# Patient Record
Sex: Female | Born: 1940 | Race: White | Hispanic: No | Marital: Married | Smoking: Never smoker
Health system: Southern US, Community
[De-identification: ages and names within clinical notes are randomized; demographics above are authoritative.]

## PROBLEM LIST (undated history)

## (undated) DIAGNOSIS — E7800 Pure hypercholesterolemia, unspecified: Secondary | ICD-10-CM

## (undated) DIAGNOSIS — M7052 Other bursitis of knee, left knee: Secondary | ICD-10-CM

## (undated) DIAGNOSIS — C4490 Unspecified malignant neoplasm of skin, unspecified: Secondary | ICD-10-CM

## (undated) DIAGNOSIS — M179 Osteoarthritis of knee, unspecified: Secondary | ICD-10-CM

## (undated) DIAGNOSIS — E559 Vitamin D deficiency, unspecified: Secondary | ICD-10-CM

## (undated) DIAGNOSIS — Z974 Presence of external hearing-aid: Secondary | ICD-10-CM

## (undated) DIAGNOSIS — E785 Hyperlipidemia, unspecified: Secondary | ICD-10-CM

## (undated) DIAGNOSIS — M1990 Unspecified osteoarthritis, unspecified site: Secondary | ICD-10-CM

## (undated) DIAGNOSIS — Z8619 Personal history of other infectious and parasitic diseases: Secondary | ICD-10-CM

## (undated) DIAGNOSIS — E039 Hypothyroidism, unspecified: Secondary | ICD-10-CM

## (undated) DIAGNOSIS — M8580 Other specified disorders of bone density and structure, unspecified site: Secondary | ICD-10-CM

## (undated) HISTORY — PX: SKIN CANCER DESTRUCTION: SHX778

## (undated) HISTORY — PX: TONSILLECTOMY: SUR1361

## (undated) HISTORY — PX: JOINT REPLACEMENT: SHX530

## (undated) HISTORY — PX: FINGER SURGERY: SHX640

## (undated) HISTORY — DX: Personal history of other infectious and parasitic diseases: Z86.19

## (undated) HISTORY — PX: BILATERAL SALPINGOOPHORECTOMY: SHX1223

## (undated) HISTORY — DX: Unspecified malignant neoplasm of skin, unspecified: C44.90

## (undated) HISTORY — DX: Hyperlipidemia, unspecified: E78.5

## (undated) HISTORY — DX: Vitamin D deficiency, unspecified: E55.9

## (undated) HISTORY — PX: APPENDECTOMY: SHX54

## (undated) HISTORY — DX: Osteoarthritis of knee, unspecified: M17.9

## (undated) HISTORY — DX: Presence of external hearing-aid: Z97.4

## (undated) HISTORY — DX: Other specified disorders of bone density and structure, unspecified site: M85.80

---

## 1988-12-10 HISTORY — PX: THYROIDECTOMY: SHX17

## 1993-12-10 HISTORY — PX: ABDOMINAL HYSTERECTOMY: SHX81

## 2005-12-10 HISTORY — PX: MENISCUS REPAIR: SHX5179

## 2014-01-25 ENCOUNTER — Other Ambulatory Visit (HOSPITAL_COMMUNITY): Payer: Self-pay | Admitting: *Deleted

## 2014-05-03 ENCOUNTER — Other Ambulatory Visit: Payer: Self-pay | Admitting: Orthopedic Surgery

## 2014-05-13 ENCOUNTER — Encounter (HOSPITAL_COMMUNITY): Payer: Self-pay | Admitting: Pharmacy Technician

## 2014-05-14 NOTE — Progress Notes (Signed)
CBC, CMET, TSH, Vitamin D, TSH, UA results 01/2014 on chart, EKG 01/28/14 on chart, surgery clearance note Dr. Waynard Edwards 02/02/14 on chart

## 2014-05-14 NOTE — Patient Instructions (Addendum)
20 Lori Meyers  05/14/2014   Your procedure is scheduled on: 05/24/14  Report to [facility] at 6:00 AM.  Call this number if you have problems the morning of surgery [phone]: [phone]   Remember:   Do not eat food or drink liquids After Midnight.     Take these medicines the morning of surgery with A SIP OF WATER: zetia, synthroid, cytomel   Do not wear jewelry, make-up or nail polish.  Do not wear lotions, powders, or perfumes. You may wear deodorant.  Do not shave 48 hours prior to surgery. Men may shave face and neck.  Do not bring valuables to the hospital.  Contacts, dentures or bridgework may not be worn into surgery.  Leave suitcase in the car. After surgery it may be brought to your room.  For patients admitted to the hospital, checkout time is 11:00 AM the day of discharge.    Please read over the following fact sheets that you were given: MRSA Information Birdie Sons, RN  pre op nurse call if needed [phone]    [facility] - Preparing for Surgery Before surgery, you can play an important role.  Because skin is not sterile, your skin needs to be as free of germs as possible.  You can reduce the number of germs on your skin by washing with CHG (chlorahexidine gluconate) soap before surgery.  CHG is an antiseptic cleaner which kills germs and bonds with the skin to continue killing germs even after washing. Please DO NOT use if you have an allergy to CHG or antibacterial soaps.  If your skin becomes reddened/irritated stop using the CHG and inform your nurse when you arrive at Short Stay. Do not shave (including legs and underarms) for at least 48 hours prior to the first CHG shower.  You may shave your face/neck. Please follow these instructions carefully:  1.  Shower with CHG Soap the night before surgery and the  morning of Surgery.  2.  If you choose to wash your hair, wash your hair first as usual with your  normal  shampoo.  3.  After you shampoo,  rinse your hair and body thoroughly to remove the  shampoo.                            4.  Use CHG as you would any other liquid soap.  You can apply chg directly  to the skin and wash                       Gently with a scrungie or clean washcloth.  5.  Apply the CHG Soap to your body ONLY FROM THE NECK DOWN.   Do not use on face/ open                           Wound or open sores. Avoid contact with eyes, ears mouth and genitals (private parts).                       Wash face,  Genitals (private parts) with your normal soap.             6.  Wash thoroughly, paying special attention to the area where your surgery  will be performed.  7.  Thoroughly rinse your body with warm water from the neck down.  8.  DO NOT shower/wash with your normal soap after using and rinsing off  the CHG Soap.                9.  Pat yourself dry with a clean towel.            10.  Wear clean pajamas.            11.  Place clean sheets on your bed the night of your first shower and do not  sleep with pets. Day of Surgery : Do not apply any lotions/deodorants the morning of surgery.  Please wear clean clothes to the hospital/surgery center.  FAILURE TO FOLLOW THESE INSTRUCTIONS MAY RESULT IN THE CANCELLATION OF YOUR SURGERY PATIENT SIGNATURE_________________________________  NURSE SIGNATURE__________________________________  ________________________________________________________________________  WHAT IS A BLOOD TRANSFUSION? Blood Transfusion Information  A transfusion is the replacement of blood or some of its parts. Blood is made up of multiple cells which provide different functions.  Red blood cells carry oxygen and are used for blood loss replacement.  White blood cells fight against infection.  Platelets control bleeding.  Plasma helps clot blood.  Other blood products are available for specialized needs, such as hemophilia or other clotting disorders. BEFORE THE TRANSFUSION  Who gives blood for  transfusions?   Healthy volunteers who are fully evaluated to make sure their blood is safe. This is blood bank blood. Transfusion therapy is the safest it has ever been in the practice of medicine. Before blood is taken from a donor, a complete history is taken to make sure that person has no history of diseases nor engages in risky social behavior (examples are intravenous drug use or sexual activity with multiple partners). The donor's travel history is screened to minimize risk of transmitting infections, such as malaria. The donated blood is tested for signs of infectious diseases, such as HIV and hepatitis. The blood is then tested to be sure it is compatible with you in order to minimize the chance of a transfusion reaction. If you or a relative donates blood, this is often done in anticipation of surgery and is not appropriate for emergency situations. It takes many days to process the donated blood. RISKS AND COMPLICATIONS Although transfusion therapy is very safe and saves many lives, the main dangers of transfusion include:   Getting an infectious disease.  Developing a transfusion reaction. This is an allergic reaction to something in the blood you were given. Every precaution is taken to prevent this. The decision to have a blood transfusion has been considered carefully by your caregiver before blood is given. Blood is not given unless the benefits outweigh the risks. AFTER THE TRANSFUSION  Right after receiving a blood transfusion, you will usually feel much better and more energetic. This is especially true if your red blood cells have gotten low (anemic). The transfusion raises the level of the red blood cells which carry oxygen, and this usually causes an energy increase.  The nurse administering the transfusion will monitor you carefully for complications. HOME CARE INSTRUCTIONS  No special instructions are needed after a transfusion. You may find your energy is better. Speak with  your caregiver about any limitations on activity for underlying diseases you may have. SEEK MEDICAL CARE IF:   Your condition is not improving after your transfusion.  You develop redness or irritation at the intravenous (IV) site. SEEK IMMEDIATE MEDICAL CARE IF:  Any of the following symptoms occur over the next 12 hours:  Shaking chills.  You  have a temperature by mouth above 102 F (38.9 C), not controlled by medicine.  Chest, back, or muscle pain.  People around you feel you are not acting correctly or are confused.  Shortness of breath or difficulty breathing.  Dizziness and fainting.  You get a rash or develop hives.  You have a decrease in urine output.  Your urine turns a dark color or changes to pink, red, or brown. Any of the following symptoms occur over the next 10 days:  You have a temperature by mouth above 102 F (38.9 C), not controlled by medicine.  Shortness of breath.  Weakness after normal activity.  The white part of the eye turns yellow (jaundice).  You have a decrease in the amount of urine or are urinating less often.  Your urine turns a dark color or changes to pink, red, or brown. Document Released: 11/23/2000 Document Revised: 02/18/2012 Document Reviewed: 07/12/2008 ExitCare Patient Information 2014 [place].  _______________________________________________________________________  Incentive Spirometer  An incentive spirometer is a tool that can help keep your lungs clear and active. This tool measures how well you are filling your lungs with each breath. Taking long deep breaths may help reverse or decrease the chance of developing breathing (pulmonary) problems (especially infection) following:  A long period of time when you are unable to move or be active. BEFORE THE PROCEDURE   If the spirometer includes an indicator to show your best effort, your nurse or respiratory therapist will set it to a desired goal.  If possible,  sit up straight or lean slightly forward. Try not to slouch.  Hold the incentive spirometer in an upright position. INSTRUCTIONS FOR USE  1. Sit on the edge of your bed if possible, or sit up as far as you can in bed or on a chair. 2. Hold the incentive spirometer in an upright position. 3. Breathe out normally. 4. Place the mouthpiece in your mouth and seal your lips tightly around it. 5. Breathe in slowly and as deeply as possible, raising the piston or the ball toward the top of the column. 6. Hold your breath for 3-5 seconds or for as long as possible. Allow the piston or ball to fall to the bottom of the column. 7. Remove the mouthpiece from your mouth and breathe out normally. 8. Rest for a few seconds and repeat Steps 1 through 7 at least 10 times every 1-2 hours when you are awake. Take your time and take a few normal breaths between deep breaths. 9. The spirometer may include an indicator to show your best effort. Use the indicator as a goal to work toward during each repetition. 10. After each set of 10 deep breaths, practice coughing to be sure your lungs are clear. If you have an incision (the cut made at the time of surgery), support your incision when coughing by placing a pillow or rolled up towels firmly against it. Once you are able to get out of bed, walk around indoors and cough well. You may stop using the incentive spirometer when instructed by your caregiver.  RISKS AND COMPLICATIONS  Take your time so you do not get dizzy or light-headed.  If you are in pain, you may need to take or ask for pain medication before doing incentive spirometry. It is harder to take a deep breath if you are having pain. AFTER USE  Rest and breathe slowly and easily.  It can be helpful to keep track of a log of your  progress. Your caregiver can provide you with a simple table to help with this. If you are using the spirometer at home, follow these instructions: [place] IF:   You  are having difficultly using the spirometer.  You have trouble using the spirometer as often as instructed.  Your pain medication is not giving enough relief while using the spirometer.  You develop fever of 100.5 F (38.1 C) or higher. SEEK IMMEDIATE MEDICAL CARE IF:   You cough up bloody sputum that had not been present before.  You develop fever of 102 F (38.9 C) or greater.  You develop worsening pain at or near the incision site. MAKE SURE YOU:   Understand these instructions.  Will watch your condition.  Will get help right away if you are not doing well or get worse. Document Released: 04/08/2007 Document Revised: 02/18/2012 Document Reviewed: 06/09/2007 [facility] Patient Information 2014 [place], [place].   ________________________________________________________________________

## 2014-05-17 ENCOUNTER — Encounter (HOSPITAL_COMMUNITY)
Admission: RE | Admit: 2014-05-17 | Discharge: 2014-05-17 | Disposition: A | Discharge status: Home / Self Care | Payer: Medicare Other | Source: Ambulatory Visit | Attending: Orthopedic Surgery | Admitting: Orthopedic Surgery

## 2014-05-17 ENCOUNTER — Encounter (HOSPITAL_COMMUNITY): Payer: Self-pay

## 2014-05-17 DIAGNOSIS — Z01812 Encounter for preprocedural laboratory examination: Secondary | ICD-10-CM | POA: Insufficient documentation

## 2014-05-17 DIAGNOSIS — Z01818 Encounter for other preprocedural examination: Secondary | ICD-10-CM | POA: Insufficient documentation

## 2014-05-17 HISTORY — DX: Hypothyroidism, unspecified: E03.9

## 2014-05-17 HISTORY — DX: Other bursitis of knee, left knee: M70.52

## 2014-05-17 HISTORY — DX: Pure hypercholesterolemia, unspecified: E78.00

## 2014-05-17 HISTORY — DX: Unspecified osteoarthritis, unspecified site: M19.90

## 2014-05-17 LAB — URINALYSIS, ROUTINE W REFLEX MICROSCOPIC
BILIRUBIN URINE: NEGATIVE
Glucose, UA: NEGATIVE mg/dL
HGB URINE DIPSTICK: NEGATIVE
KETONES UR: NEGATIVE mg/dL
Leukocytes, UA: NEGATIVE
Nitrite: NEGATIVE
PROTEIN: NEGATIVE mg/dL
Specific Gravity, Urine: 1.002 — ABNORMAL LOW (ref 1.005–1.030)
UROBILINOGEN UA: 0.2 mg/dL (ref 0.0–1.0)
pH: 6 (ref 5.0–8.0)

## 2014-05-17 LAB — CBC
HEMATOCRIT: 42.9 % (ref 36.0–46.0)
Hemoglobin: 14.7 g/dL (ref 12.0–15.0)
MCH: 29.7 pg (ref 26.0–34.0)
MCHC: 34.3 g/dL (ref 30.0–36.0)
MCV: 86.7 fL (ref 78.0–100.0)
Platelets: 172 10*3/uL (ref 150–400)
RBC: 4.95 MIL/uL (ref 3.87–5.11)
RDW: 13.1 % (ref 11.5–15.5)
WBC: 5.7 10*3/uL (ref 4.0–10.5)

## 2014-05-17 LAB — SURGICAL PCR SCREEN
MRSA, PCR: NEGATIVE
Staphylococcus aureus: NEGATIVE

## 2014-05-17 LAB — COMPREHENSIVE METABOLIC PANEL
ALK PHOS: 76 U/L (ref 39–117)
ALT: 26 U/L (ref 0–35)
AST: 21 U/L (ref 0–37)
Albumin: 4.2 g/dL (ref 3.5–5.2)
BILIRUBIN TOTAL: 0.8 mg/dL (ref 0.3–1.2)
BUN: 12 mg/dL (ref 6–23)
CHLORIDE: 102 meq/L (ref 96–112)
CO2: 28 meq/L (ref 19–32)
Calcium: 9.5 mg/dL (ref 8.4–10.5)
Creatinine, Ser: 0.63 mg/dL (ref 0.50–1.10)
GFR, EST NON AFRICAN AMERICAN: 87 mL/min — AB (ref >90)
Glucose, Bld: 105 mg/dL — ABNORMAL HIGH (ref 70–99)
POTASSIUM: 5.1 meq/L (ref 3.7–5.3)
Sodium: 141 mEq/L (ref 137–147)
Total Protein: 7.4 g/dL (ref 6.0–8.3)

## 2014-05-17 LAB — APTT: APTT: 29 s (ref 24–37)

## 2014-05-17 LAB — PROTIME-INR
INR: 0.95 (ref 0.00–1.49)
PROTHROMBIN TIME: 12.5 s (ref 11.6–15.2)

## 2014-05-20 ENCOUNTER — Other Ambulatory Visit: Payer: Self-pay | Admitting: Surgical

## 2014-05-20 NOTE — H&P (Signed)
TOTAL KNEE ADMISSION H&P  Patient is being admitted for right total knee arthroplasty.  Subjective:  Chief Complaint:right knee pain.  HPI: Lori Meyers, 73 y.o. female, has a history of pain and functional disability in the right knee due to arthritis and has failed non-surgical conservative treatments for greater than 12 weeks to includeNSAID's and/or analgesics, corticosteriod injections, viscosupplementation injections and activity modification.  Onset of symptoms was gradual, starting 5 years ago with gradually worsening course since that time. The patient noted no past surgery on the right knee(s).  Patient currently rates pain in the right knee(s) at 6 out of 10 with activity. Patient has night pain, worsening of pain with activity and weight bearing, pain that interferes with activities of daily living, pain with passive range of motion, crepitus and joint swelling.  Patient has evidence of periarticular osteophytes and joint space narrowing by imaging studies. There is no active infection.   Past Medical History  Diagnosis Date  . Hypercholesteremia     under control  . Hypothyroidism     thyroidectomy-under control  . Arthritis     arms, knees  . Bursitis of left knee     Past Surgical History  Procedure Laterality Date  . Thyroidectomy  1990    "zapped"  . Finger surgery  ~2005    joint replaced on right hand middle finger  . 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 Tonsillectomy  as child  . Abdominal hysterectomy  1995    with bladder tac  . Appendectomy  5th grade  . Meniscus repair Left 2007    at office     Current outpatient prescriptions: Menthol, Topical Analgesic, (BLUE-EMU MAXIMUM STRENGTH EX), Apply 1 application topically every 6 (six) hours as needed (apply to knees)., Disp: , Rfl: ;   atorvastatin (LIPITOR) 80 MG tablet, Take 80 mg by mouth daily., Disp: , Rfl: ;   celecoxib (CELEBREX) 200 MG capsule, Take 200 mg by mouth  daily., Disp: , Rfl: ;   ezetimibe (ZETIA) 10 MG tablet, Take 10 mg by mouth every morning., Disp: , Rfl:  glucosamine-chondroitin 500-400 MG tablet, Take 1 tablet by mouth daily., Disp: , Rfl: ;   levothyroxine (SYNTHROID, LEVOTHROID) 50 MCG tablet, Take 75-100 mcg by mouth daily before breakfast. Takes 2 tablets on Monday Wednesday and Friday and 1 and 1/2 the rest of the week, Disp: , Rfl: ;  liothyronine (CYTOMEL) 5 MCG tablet, Take 5 mcg by mouth 2 (two) times daily., Disp: , Rfl:  Multiple Vitamin (MULTIVITAMIN WITH MINERALS) TABS tablet, Take 1 tablet by mouth daily., Disp: , Rfl: ;   Vitamin D, Ergocalciferol, (DRISDOL) 50000 UNITS CAPS capsule, Take 50,000 Units by mouth every 7 (seven) days., Disp: , Rfl:   Allergies  Allergen Reactions  . Niacin And Related     Passed out    History  Substance Use Topics  . Smoking status: Never Smoker   . Smokeless tobacco: Never Used  . Alcohol Use: No      Review of Systems  Constitutional: Negative.   HENT: Negative for congestion, ear discharge, ear pain, hearing loss, nosebleeds, sore throat and tinnitus.   Eyes: Negative.   Respiratory: Negative.  Negative for stridor.   Cardiovascular: Negative.   Gastrointestinal: Negative.   Genitourinary: Positive for frequency. Negative for dysuria, urgency, hematuria and flank pain.  Musculoskeletal: Positive for joint pain and neck pain. Negative for back pain, falls and myalgias.  Right knee pain  Skin: Negative.   Neurological: Negative.  Negative for headaches.  Endo/Heme/Allergies: Negative.   Psychiatric/Behavioral: Negative.     Objective:  Physical Exam  Constitutional: She is oriented to person, place, and time. She appears well-developed and well-nourished.  HENT:  Head: Normocephalic and atraumatic.  Right Ear: External ear normal.  Left Ear: External ear normal.  Nose: Nose normal.  Mouth/Throat: Oropharynx is clear and moist.  Eyes: Conjunctivae and EOM are  normal.  Neck: Normal range of motion. Neck supple.  Cardiovascular: Normal rate, regular rhythm, normal heart sounds and intact distal pulses.   No murmur heard. Respiratory: Effort normal and breath sounds normal. No respiratory distress. She has no wheezes.  GI: Soft. Bowel sounds are normal. She exhibits no distension. There is no tenderness.  Musculoskeletal:       Right shoulder: Normal.       Right hip: Normal.       Right knee: She exhibits decreased range of motion and swelling. She exhibits no effusion and no erythema. Tenderness found. Medial joint line and lateral joint line tenderness noted.       Left knee: She exhibits decreased range of motion and swelling. She exhibits no effusion and no erythema. Tenderness found. Medial joint line and lateral joint line tenderness noted.       Right lower leg: She exhibits no tenderness and no swelling.       Left lower leg: She exhibits no tenderness and no swelling.  Her hips show a normal range of motion with no discomfort. Her left knee shows no effusion. Range of motion about 5-125 degrees. Slight crepitus on range of motion. Slight tenderness medial greater than lateral. No instability. Right knee with no effusion. Range of motion 10-120 degrees. Moderate crepitus on range of motion. Tenderness medial greater than lateral with no instability noted. Pulses, sensation, and motor intact in both lower extremities.  Neurological: She is alert and oriented to person, place, and time. She has normal strength and normal reflexes. No sensory deficit.  Skin: No rash noted. She is not diaphoretic. No erythema.  Psychiatric: She has a normal mood and affect. Her behavior is normal.    Vitals Weight: 168.5 lb Height: 63 in Body Surface Area: 1.84 m Body Mass Index: 29.85 kg/m Pulse: 84 (Regular) BP: 144/94 (Sitting, Left Arm, Standard)  Imaging Review Plain radiographs demonstrate severe degenerative joint disease of the right  knee(s). The overall alignment ismild varus. The bone quality appears to be adequate for age and reported activity level.  Assessment/Plan:  End stage arthritis, right knee   The patient history, physical examination, clinical judgment of the provider and imaging studies are consistent with end stage degenerative joint disease of the right knee(s) and total knee arthroplasty is deemed medically necessary. The treatment options including medical management, injection therapy arthroscopy and arthroplasty were discussed at length. The risks and benefits of total knee arthroplasty were presented and reviewed. The risks due to aseptic loosening, infection, stiffness, patella tracking problems, thromboembolic complications and other imponderables were discussed. The patient acknowledged the explanation, agreed to proceed with the plan and consent was signed. Patient is being admitted for inpatient treatment for surgery, pain control, PT, OT, prophylactic antibiotics, VTE prophylaxis, progressive ambulation and ADL's and discharge planning. The patient is planning to be discharged to skilled nursing facility    TXA ok   Dimitri Ped, PA-C

## 2014-05-24 ENCOUNTER — Inpatient Hospital Stay (HOSPITAL_COMMUNITY)
Admission: RE | Admit: 2014-05-24 | Discharge: 2014-05-27 | DRG: 470 | Disposition: A | Discharge status: Skilled Nursing Facility | Payer: Medicare Other | Source: Ambulatory Visit | Attending: Orthopedic Surgery | Admitting: Orthopedic Surgery

## 2014-05-24 ENCOUNTER — Inpatient Hospital Stay (HOSPITAL_COMMUNITY): Payer: Medicare Other | Admitting: Anesthesiology

## 2014-05-24 ENCOUNTER — Encounter (HOSPITAL_COMMUNITY): Payer: Medicare Other | Admitting: Anesthesiology

## 2014-05-24 ENCOUNTER — Encounter (HOSPITAL_COMMUNITY): Payer: Self-pay | Admitting: *Deleted

## 2014-05-24 ENCOUNTER — Encounter (HOSPITAL_COMMUNITY)
Admission: RE | Disposition: A | Discharge status: Skilled Nursing Facility | Payer: Self-pay | Source: Ambulatory Visit | Attending: Orthopedic Surgery

## 2014-05-24 DIAGNOSIS — E7800 Pure hypercholesterolemia, unspecified: Secondary | ICD-10-CM | POA: Diagnosis present

## 2014-05-24 DIAGNOSIS — Z01812 Encounter for preprocedural laboratory examination: Secondary | ICD-10-CM

## 2014-05-24 DIAGNOSIS — Z6829 Body mass index (BMI) 29.0-29.9, adult: Secondary | ICD-10-CM

## 2014-05-24 DIAGNOSIS — M179 Osteoarthritis of knee, unspecified: Secondary | ICD-10-CM | POA: Diagnosis present

## 2014-05-24 DIAGNOSIS — Z79899 Other long term (current) drug therapy: Secondary | ICD-10-CM

## 2014-05-24 DIAGNOSIS — Z96651 Presence of right artificial knee joint: Secondary | ICD-10-CM

## 2014-05-24 DIAGNOSIS — E890 Postprocedural hypothyroidism: Secondary | ICD-10-CM | POA: Diagnosis present

## 2014-05-24 DIAGNOSIS — M1710 Unilateral primary osteoarthritis, unspecified knee: Principal | ICD-10-CM | POA: Diagnosis present

## 2014-05-24 HISTORY — PX: TOTAL KNEE ARTHROPLASTY: SHX125

## 2014-05-24 LAB — TYPE AND SCREEN
ABO/RH(D): O POS
Antibody Screen: NEGATIVE

## 2014-05-24 LAB — ABO/RH: ABO/RH(D): O POS

## 2014-05-24 SURGERY — ARTHROPLASTY, KNEE, TOTAL
Anesthesia: Spinal | Site: Knee | Laterality: Right

## 2014-05-24 MED ORDER — POLYETHYLENE GLYCOL 3350 17 G PO PACK: 17.0000 g | PACK | Freq: Every day | ORAL | Status: DC | PRN

## 2014-05-24 MED ORDER — FENTANYL CITRATE 0.05 MG/ML IJ SOLN
INTRAMUSCULAR | Status: AC
  Filled 2014-05-24: qty 2

## 2014-05-24 MED ORDER — METOCLOPRAMIDE HCL 5 MG/ML IJ SOLN: 5.0000 mg | Freq: Three times a day (TID) | INTRAMUSCULAR | Status: DC | PRN

## 2014-05-24 MED ORDER — SODIUM CHLORIDE 0.9 % IJ SOLN
INTRAMUSCULAR | Status: DC | PRN
  Administered 2014-05-24: 30 mL

## 2014-05-24 MED ORDER — DIPHENHYDRAMINE HCL 12.5 MG/5ML PO ELIX: 12.5000 mg | ORAL_SOLUTION | ORAL | Status: DC | PRN

## 2014-05-24 MED ORDER — OXYCODONE HCL 5 MG/5ML PO SOLN
5.0000 mg | Freq: Once | ORAL | Status: DC | PRN
  Filled 2014-05-24: qty 5

## 2014-05-24 MED ORDER — CEFAZOLIN SODIUM-DEXTROSE 2-3 GM-% IV SOLR
2.0000 g | Freq: Four times a day (QID) | INTRAVENOUS | Status: AC
  Administered 2014-05-24 (×2): 2 g via INTRAVENOUS
  Filled 2014-05-24 (×2): qty 50

## 2014-05-24 MED ORDER — DEXAMETHASONE SODIUM PHOSPHATE 10 MG/ML IJ SOLN
10.0000 mg | Freq: Three times a day (TID) | INTRAMUSCULAR | Status: DC
Start: 2014-05-24 — End: 2014-05-24
  Filled 2014-05-24 (×3): qty 1

## 2014-05-24 MED ORDER — LIDOCAINE HCL (CARDIAC) 20 MG/ML IV SOLN
INTRAVENOUS | Status: AC
  Filled 2014-05-24: qty 5

## 2014-05-24 MED ORDER — FENTANYL CITRATE 0.05 MG/ML IJ SOLN
INTRAMUSCULAR | Status: DC | PRN
  Administered 2014-05-24 (×2): 50 ug via INTRAVENOUS

## 2014-05-24 MED ORDER — LEVOTHYROXINE SODIUM 75 MCG PO TABS: 75.0000 ug | ORAL_TABLET | Freq: Every day | ORAL | Status: DC

## 2014-05-24 MED ORDER — DEXAMETHASONE SODIUM PHOSPHATE 10 MG/ML IJ SOLN
10.0000 mg | Freq: Once | INTRAMUSCULAR | Status: AC
  Administered 2014-05-25: 10 mg via INTRAVENOUS
  Filled 2014-05-24: qty 1

## 2014-05-24 MED ORDER — EZETIMIBE 10 MG PO TABS
10.0000 mg | ORAL_TABLET | Freq: Every morning | ORAL | Status: DC
  Administered 2014-05-25 – 2014-05-27 (×3): 10 mg via ORAL
  Filled 2014-05-24 (×3): qty 1

## 2014-05-24 MED ORDER — METHOCARBAMOL 1000 MG/10ML IJ SOLN
500.0000 mg | Freq: Four times a day (QID) | INTRAVENOUS | Status: DC | PRN
  Administered 2014-05-24: 500 mg via INTRAVENOUS
  Filled 2014-05-24: qty 5

## 2014-05-24 MED ORDER — DEXAMETHASONE 6 MG PO TABS
10.0000 mg | ORAL_TABLET | Freq: Three times a day (TID) | ORAL | Status: DC
  Filled 2014-05-24 (×3): qty 1

## 2014-05-24 MED ORDER — BUPIVACAINE LIPOSOME 1.3 % IJ SUSP
INTRAMUSCULAR | Status: DC | PRN
  Administered 2014-05-24: 20 mL

## 2014-05-24 MED ORDER — LEVOTHYROXINE SODIUM 100 MCG PO TABS
100.0000 ug | ORAL_TABLET | ORAL | Status: DC
  Administered 2014-05-26: 100 ug via ORAL
  Filled 2014-05-24 (×2): qty 1

## 2014-05-24 MED ORDER — MIDAZOLAM HCL 5 MG/5ML IJ SOLN
INTRAMUSCULAR | Status: DC | PRN
  Administered 2014-05-24 (×4): 0.5 mg via INTRAVENOUS
  Administered 2014-05-24: 2 mg via INTRAVENOUS

## 2014-05-24 MED ORDER — ACETAMINOPHEN 650 MG RE SUPP: 650.0000 mg | Freq: Four times a day (QID) | RECTAL | Status: DC | PRN

## 2014-05-24 MED ORDER — LEVOTHYROXINE SODIUM 75 MCG PO TABS
75.0000 ug | ORAL_TABLET | ORAL | Status: DC
  Administered 2014-05-25 – 2014-05-27 (×2): 75 ug via ORAL
  Filled 2014-05-24 (×2): qty 1

## 2014-05-24 MED ORDER — MIDAZOLAM HCL 2 MG/2ML IJ SOLN
INTRAMUSCULAR | Status: AC
  Filled 2014-05-24: qty 2

## 2014-05-24 MED ORDER — TRANEXAMIC ACID 100 MG/ML IV SOLN
1000.0000 mg | INTRAVENOUS | Status: AC
  Administered 2014-05-24: 1000 mg via INTRAVENOUS
  Filled 2014-05-24: qty 10

## 2014-05-24 MED ORDER — ONDANSETRON HCL 4 MG/2ML IJ SOLN: 4.0000 mg | Freq: Four times a day (QID) | INTRAMUSCULAR | Status: DC | PRN

## 2014-05-24 MED ORDER — ONDANSETRON HCL 4 MG/2ML IJ SOLN
INTRAMUSCULAR | Status: DC | PRN
  Administered 2014-05-24: 4 mg via INTRAVENOUS

## 2014-05-24 MED ORDER — METOCLOPRAMIDE HCL 10 MG PO TABS: 5.0000 mg | ORAL_TABLET | Freq: Three times a day (TID) | ORAL | Status: DC | PRN

## 2014-05-24 MED ORDER — HYDROMORPHONE HCL PF 1 MG/ML IJ SOLN
INTRAMUSCULAR | Status: AC
  Filled 2014-05-24: qty 1

## 2014-05-24 MED ORDER — MORPHINE SULFATE 2 MG/ML IJ SOLN: 1.0000 mg | INTRAMUSCULAR | Status: DC | PRN

## 2014-05-24 MED ORDER — DOCUSATE SODIUM 100 MG PO CAPS
100.0000 mg | ORAL_CAPSULE | Freq: Two times a day (BID) | ORAL | Status: DC
  Administered 2014-05-24 – 2014-05-27 (×4): 100 mg via ORAL

## 2014-05-24 MED ORDER — SODIUM CHLORIDE 0.9 % IV SOLN: INTRAVENOUS | Status: DC

## 2014-05-24 MED ORDER — SODIUM CHLORIDE 0.9 % IV SOLN
INTRAVENOUS | Status: DC
  Administered 2014-05-24 – 2014-05-25 (×2): via INTRAVENOUS

## 2014-05-24 MED ORDER — OXYCODONE HCL 5 MG PO TABS: 5.0000 mg | ORAL_TABLET | Freq: Once | ORAL | Status: DC | PRN

## 2014-05-24 MED ORDER — PHENYLEPHRINE 40 MCG/ML (10ML) SYRINGE FOR IV PUSH (FOR BLOOD PRESSURE SUPPORT)
PREFILLED_SYRINGE | INTRAVENOUS | Status: AC
  Filled 2014-05-24: qty 10

## 2014-05-24 MED ORDER — MEPERIDINE HCL 50 MG/ML IJ SOLN: 6.2500 mg | INTRAMUSCULAR | Status: DC | PRN

## 2014-05-24 MED ORDER — SODIUM CHLORIDE 0.9 % IJ SOLN
INTRAMUSCULAR | Status: AC
  Filled 2014-05-24: qty 50

## 2014-05-24 MED ORDER — DEXAMETHASONE SODIUM PHOSPHATE 10 MG/ML IJ SOLN
INTRAMUSCULAR | Status: AC
Start: 2014-05-24 — End: 2014-05-24
  Filled 2014-05-24: qty 1

## 2014-05-24 MED ORDER — ATORVASTATIN CALCIUM 80 MG PO TABS
80.0000 mg | ORAL_TABLET | Freq: Every day | ORAL | Status: DC
  Administered 2014-05-24 – 2014-05-26 (×3): 80 mg via ORAL
  Filled 2014-05-24 (×4): qty 1

## 2014-05-24 MED ORDER — ACETAMINOPHEN 500 MG PO TABS
1000.0000 mg | ORAL_TABLET | Freq: Four times a day (QID) | ORAL | Status: AC
  Administered 2014-05-24 – 2014-05-25 (×4): 1000 mg via ORAL
  Filled 2014-05-24 (×8): qty 2

## 2014-05-24 MED ORDER — ACETAMINOPHEN 325 MG PO TABS: 650.0000 mg | ORAL_TABLET | Freq: Four times a day (QID) | ORAL | Status: DC | PRN

## 2014-05-24 MED ORDER — DEXAMETHASONE SODIUM PHOSPHATE 10 MG/ML IJ SOLN
10.0000 mg | Freq: Once | INTRAMUSCULAR | Status: AC
  Administered 2014-05-24: 10 mg via INTRAVENOUS

## 2014-05-24 MED ORDER — TRAMADOL HCL 50 MG PO TABS
50.0000 mg | ORAL_TABLET | Freq: Four times a day (QID) | ORAL | Status: DC | PRN
  Administered 2014-05-25: 100 mg via ORAL
  Administered 2014-05-25: 50 mg via ORAL
  Administered 2014-05-25 – 2014-05-27 (×6): 100 mg via ORAL
  Filled 2014-05-24 (×5): qty 2
  Filled 2014-05-24: qty 1
  Filled 2014-05-24 (×2): qty 2

## 2014-05-24 MED ORDER — MENTHOL 3 MG MT LOZG
1.0000 | LOZENGE | OROMUCOSAL | Status: DC | PRN
  Filled 2014-05-24: qty 9

## 2014-05-24 MED ORDER — KETOROLAC TROMETHAMINE 15 MG/ML IJ SOLN
7.5000 mg | Freq: Four times a day (QID) | INTRAMUSCULAR | Status: AC | PRN
  Administered 2014-05-24 (×2): 7.5 mg via INTRAVENOUS
  Filled 2014-05-24 (×2): qty 1

## 2014-05-24 MED ORDER — RIVAROXABAN 10 MG PO TABS
10.0000 mg | ORAL_TABLET | Freq: Every day | ORAL | Status: DC
  Administered 2014-05-25 – 2014-05-27 (×3): 10 mg via ORAL
  Filled 2014-05-24 (×4): qty 1

## 2014-05-24 MED ORDER — HYDROMORPHONE HCL PF 1 MG/ML IJ SOLN
0.2500 mg | INTRAMUSCULAR | Status: DC | PRN
  Administered 2014-05-24 (×2): 0.5 mg via INTRAVENOUS

## 2014-05-24 MED ORDER — PROPOFOL INFUSION 10 MG/ML OPTIME
INTRAVENOUS | Status: DC | PRN
  Administered 2014-05-24: 50 ug/kg/min via INTRAVENOUS

## 2014-05-24 MED ORDER — ONDANSETRON HCL 4 MG PO TABS
4.0000 mg | ORAL_TABLET | Freq: Four times a day (QID) | ORAL | Status: DC | PRN
  Administered 2014-05-25: 4 mg via ORAL
  Filled 2014-05-24: qty 1

## 2014-05-24 MED ORDER — METHOCARBAMOL 500 MG PO TABS
500.0000 mg | ORAL_TABLET | Freq: Four times a day (QID) | ORAL | Status: DC | PRN
  Administered 2014-05-25 (×2): 500 mg via ORAL
  Filled 2014-05-24 (×2): qty 1

## 2014-05-24 MED ORDER — CEFAZOLIN SODIUM-DEXTROSE 2-3 GM-% IV SOLR
2.0000 g | INTRAVENOUS | Status: AC
  Administered 2014-05-24: 2 g via INTRAVENOUS

## 2014-05-24 MED ORDER — ACETAMINOPHEN 10 MG/ML IV SOLN
1000.0000 mg | Freq: Once | INTRAVENOUS | Status: AC
  Administered 2014-05-24: 1000 mg via INTRAVENOUS
  Filled 2014-05-24: qty 100

## 2014-05-24 MED ORDER — PHENOL 1.4 % MT LIQD
1.0000 | OROMUCOSAL | Status: DC | PRN
  Filled 2014-05-24: qty 177

## 2014-05-24 MED ORDER — PROMETHAZINE HCL 25 MG/ML IJ SOLN: 6.2500 mg | INTRAMUSCULAR | Status: DC | PRN

## 2014-05-24 MED ORDER — PROPOFOL 10 MG/ML IV BOLUS
INTRAVENOUS | Status: AC
  Filled 2014-05-24: qty 20

## 2014-05-24 MED ORDER — OXYCODONE HCL 5 MG PO TABS
5.0000 mg | ORAL_TABLET | ORAL | Status: DC | PRN
  Administered 2014-05-26: 10 mg via ORAL
  Filled 2014-05-24: qty 2
  Filled 2014-05-24: qty 1

## 2014-05-24 MED ORDER — BISACODYL 10 MG RE SUPP: 10.0000 mg | Freq: Every day | RECTAL | Status: DC | PRN

## 2014-05-24 MED ORDER — FLEET ENEMA 7-19 GM/118ML RE ENEM: 1.0000 | ENEMA | Freq: Once | RECTAL | Status: AC | PRN

## 2014-05-24 MED ORDER — BUPIVACAINE LIPOSOME 1.3 % IJ SUSP
20.0000 mL | Freq: Once | INTRAMUSCULAR | Status: DC
  Filled 2014-05-24: qty 20

## 2014-05-24 MED ORDER — CEFAZOLIN SODIUM-DEXTROSE 2-3 GM-% IV SOLR
INTRAVENOUS | Status: AC
  Filled 2014-05-24: qty 50

## 2014-05-24 MED ORDER — LACTATED RINGERS IV SOLN
INTRAVENOUS | Status: DC | PRN
  Administered 2014-05-24 (×2): via INTRAVENOUS

## 2014-05-24 MED ORDER — LIOTHYRONINE SODIUM 5 MCG PO TABS
5.0000 ug | ORAL_TABLET | Freq: Two times a day (BID) | ORAL | Status: DC
  Administered 2014-05-24 – 2014-05-27 (×6): 5 ug via ORAL
  Filled 2014-05-24 (×7): qty 1

## 2014-05-24 MED ORDER — BUPIVACAINE IN DEXTROSE 0.75-8.25 % IT SOLN
INTRATHECAL | Status: DC | PRN
  Administered 2014-05-24: 15 mg via INTRATHECAL

## 2014-05-24 MED ORDER — BUPIVACAINE HCL 0.25 % IJ SOLN
INTRAMUSCULAR | Status: DC | PRN
  Administered 2014-05-24: 20 mL

## 2014-05-24 MED ORDER — PHENYLEPHRINE HCL 10 MG/ML IJ SOLN
INTRAMUSCULAR | Status: DC | PRN
  Administered 2014-05-24 (×2): 40 ug via INTRAVENOUS

## 2014-05-24 MED ORDER — BUPIVACAINE HCL (PF) 0.25 % IJ SOLN
INTRAMUSCULAR | Status: AC
  Filled 2014-05-24: qty 30

## 2014-05-24 SURGICAL SUPPLY — 63 items
BAG SPEC THK2 15X12 ZIP CLS (MISCELLANEOUS) ×1
BAG ZIPLOCK 12X15 (MISCELLANEOUS) ×3 IMPLANT
BANDAGE ELASTIC 6 VELCRO ST LF (GAUZE/BANDAGES/DRESSINGS) ×3 IMPLANT
BANDAGE ESMARK 6X9 LF (GAUZE/BANDAGES/DRESSINGS) ×1 IMPLANT
BLADE SAG 18X100X1.27 (BLADE) ×3 IMPLANT
BLADE SAW SGTL 11.0X1.19X90.0M (BLADE) ×3 IMPLANT
BNDG CMPR 9X6 STRL LF SNTH (GAUZE/BANDAGES/DRESSINGS) ×1
BNDG ESMARK 6X9 LF (GAUZE/BANDAGES/DRESSINGS) ×3
BOWL SMART MIX CTS (DISPOSABLE) ×3 IMPLANT
CAPT RP KNEE ×2 IMPLANT
CEMENT HV SMART SET (Cement) ×6 IMPLANT
CLOSURE WOUND 1/2 X4 (GAUZE/BANDAGES/DRESSINGS) ×1
CUFF TOURN SGL QUICK 34 (TOURNIQUET CUFF) ×3
CUFF TRNQT CYL 34X4X40X1 (TOURNIQUET CUFF) ×1 IMPLANT
DECANTER SPIKE VIAL GLASS SM (MISCELLANEOUS) ×3 IMPLANT
DRAPE EXTREMITY T 121X128X90 (DRAPE) ×3 IMPLANT
DRAPE POUCH INSTRU U-SHP 10X18 (DRAPES) ×3 IMPLANT
DRAPE U-SHAPE 47X51 STRL (DRAPES) ×3 IMPLANT
DRSG ADAPTIC 3X8 NADH LF (GAUZE/BANDAGES/DRESSINGS) ×3 IMPLANT
DRSG PAD ABDOMINAL 8X10 ST (GAUZE/BANDAGES/DRESSINGS) ×1 IMPLANT
DURAPREP 26ML APPLICATOR (WOUND CARE) ×3 IMPLANT
ELECT REM PT RETURN 9FT ADLT (ELECTROSURGICAL) ×3
ELECTRODE REM PT RTRN 9FT ADLT (ELECTROSURGICAL) ×1 IMPLANT
EVACUATOR 1/8 PVC DRAIN (DRAIN) ×3 IMPLANT
FACESHIELD WRAPAROUND (MASK) ×15 IMPLANT
FACESHIELD WRAPAROUND OR TEAM (MASK) ×5 IMPLANT
GAUZE SPONGE 4X4 12PLY STRL (GAUZE/BANDAGES/DRESSINGS) ×1 IMPLANT
GLOVE BIO SURGEON STRL SZ7.5 (GLOVE) IMPLANT
GLOVE BIO SURGEON STRL SZ8 (GLOVE) ×3 IMPLANT
GLOVE BIOGEL PI IND STRL 6.5 (GLOVE) IMPLANT
GLOVE BIOGEL PI IND STRL 8 (GLOVE) ×1 IMPLANT
GLOVE BIOGEL PI INDICATOR 6.5 (GLOVE)
GLOVE BIOGEL PI INDICATOR 8 (GLOVE) ×2
GLOVE SURG SS PI 6.5 STRL IVOR (GLOVE) IMPLANT
GOWN STRL REUS W/TWL LRG LVL3 (GOWN DISPOSABLE) ×3 IMPLANT
GOWN STRL REUS W/TWL XL LVL3 (GOWN DISPOSABLE) IMPLANT
HANDPIECE INTERPULSE COAX TIP (DISPOSABLE) ×3
IMMOBILIZER KNEE 20 (SOFTGOODS) ×2 IMPLANT
IMMOBILIZER KNEE 20 THIGH 36 (SOFTGOODS) ×1 IMPLANT
KIT BASIN OR (CUSTOM PROCEDURE TRAY) ×3 IMPLANT
MANIFOLD NEPTUNE II (INSTRUMENTS) ×3 IMPLANT
NDL SAFETY ECLIPSE 18X1.5 (NEEDLE) ×2 IMPLANT
NEEDLE HYPO 18GX1.5 SHARP (NEEDLE) ×6
NS IRRIG 1000ML POUR BTL (IV SOLUTION) ×3 IMPLANT
PACK TOTAL JOINT (CUSTOM PROCEDURE TRAY) ×3 IMPLANT
PAD ABD 8X10 STRL (GAUZE/BANDAGES/DRESSINGS) ×2 IMPLANT
PADDING CAST COTTON 6X4 STRL (CAST SUPPLIES) ×4 IMPLANT
POSITIONER SURGICAL ARM (MISCELLANEOUS) ×3 IMPLANT
SET HNDPC FAN SPRY TIP SCT (DISPOSABLE) ×1 IMPLANT
SPONGE GAUZE 4X4 12PLY (GAUZE/BANDAGES/DRESSINGS) ×2 IMPLANT
STRIP CLOSURE SKIN 1/2X4 (GAUZE/BANDAGES/DRESSINGS) ×3 IMPLANT
SUCTION FRAZIER 12FR DISP (SUCTIONS) ×3 IMPLANT
SUT MNCRL AB 4-0 PS2 18 (SUTURE) ×3 IMPLANT
SUT VIC AB 2-0 CT1 27 (SUTURE) ×9
SUT VIC AB 2-0 CT1 TAPERPNT 27 (SUTURE) ×3 IMPLANT
SUT VLOC 180 0 24IN GS25 (SUTURE) ×3 IMPLANT
SYR 20CC LL (SYRINGE) ×3 IMPLANT
SYR 50ML LL SCALE MARK (SYRINGE) ×3 IMPLANT
TOWEL OR 17X26 10 PK STRL BLUE (TOWEL DISPOSABLE) ×3 IMPLANT
TOWEL OR NON WOVEN STRL DISP B (DISPOSABLE) IMPLANT
TRAY FOLEY CATH 14FRSI W/METER (CATHETERS) ×3 IMPLANT
WATER STERILE IRR 1500ML POUR (IV SOLUTION) ×3 IMPLANT
WRAP KNEE MAXI GEL POST OP (GAUZE/BANDAGES/DRESSINGS) ×3 IMPLANT

## 2014-05-24 NOTE — Plan of Care (Signed)
Problem: Consults Goal: Diagnosis- Total Joint Replacement Right total knee     

## 2014-05-24 NOTE — Anesthesia Procedure Notes (Signed)
Spinal  Start time: 05/24/2014 8:16 AM End time: 05/24/2014 8:22 AM Staffing CRNA/Resident: Durward ParcelFLYNN-COOK, Zabrina Brotherton A Performed by: resident/CRNA  Preanesthetic Checklist Completed: patient identified, site marked, surgical consent, pre-op evaluation, timeout performed, IV checked, risks and benefits discussed and monitors and equipment checked Spinal Block Patient position: sitting Prep: Betadine Patient monitoring: heart rate, cardiac monitor, continuous pulse ox and blood pressure Approach: midline Location: L3-4 Injection technique: single-shot Needle Needle type: Quincke  Needle gauge: 22 G Needle length: 9 cm Needle insertion depth: 4 cm Assessment Sensory level: T8

## 2014-05-24 NOTE — Progress Notes (Signed)
Clinical Social Work Department CLINICAL SOCIAL WORK PLACEMENT NOTE 05/24/2014  Patient:  Lori Meyers,Lori Meyers  Account Number:  [id number] Admit date:  05/24/2014  Clinical Social Worker:  Cori RazorJAMIE Engelbert Sevin, LCSW  Date/time:  05/24/2014 03:15 PM  Clinical Social Work is seeking post-discharge placement for this patient at the following level of care:   SKILLED NURSING   (*CSW will update this form in Epic as items are completed)     Patient/family provided with [facility] Department of Clinical Social Work's list of facilities offering this level of care within the geographic area requested by the patient (or if unable, by the patient's family).  05/24/2014  Patient/family informed of their freedom to choose among providers that offer the needed level of care, that participate in Medicare, Medicaid or managed care program needed by the patient, have an available bed and are willing to accept the patient.    Patient/family informed of [facility]' ownership interest in [facility], as well as of the fact that they are under no obligation to receive care at this facility.  PASARR submitted to EDS on 05/24/2014 PASARR number received on 05/24/2014  FL2 transmitted to all facilities in geographic area requested by pt/family on  05/24/2014 FL2 transmitted to all facilities within larger geographic area on   Patient informed that his/her managed care company has contracts with or will negotiate with  certain facilities, including the following:     Patient/family informed of bed offers received:  05/24/2014 [facility] Physician recommends and patient chooses bed at    Patient to be transferred to  on   Patient to be transferred to facility by  Patient and family notified of transfer on  Name of family member notified:    The following physician request were entered in Epic:   Additional Comments:  Cori RazorJamie Mekai Wilkinson LCSW [phone]

## 2014-05-24 NOTE — Op Note (Signed)
Pre-operative diagnosis- Osteoarthritis  Right knee(s)  Post-operative diagnosis- Osteoarthritis Right knee(s)  Procedure-  Right  Total Knee Arthroplasty  Surgeon- Gus RankinFrank V. Ayonna Speranza, MD  Assistant- Avel Peacerew Perkins PA-C   Anesthesia-  Spinal  EBL-* No blood loss amount entered *   Drains Hemovac  Tourniquet time-  Total Tourniquet Time Documented: Thigh (Right) - 33 minutes Total: Thigh (Right) - 33 minutes     Complications- None  Condition-PACU - hemodynamically stable.   Brief Clinical Note  Ned CardGail W Meyers is a 73 y.o. year old female with end stage OA of her right knee with progressively worsening pain and dysfunction. She has constant pain, with activity and at rest and significant functional deficits with difficulties even with ADLs. She has had extensive non-op management including analgesics, injections of cortisone and viscosupplements, and home exercise program, but remains in significant pain with significant dysfunction.Radiographs show bone on bone arthritis medial and patellofemoral. She presents now for right Total Knee Arthroplasty.    Procedure in detail---   The patient is brought into the operating room and positioned supine on the operating table. After successful administration of  Spinal,   a tourniquet is placed high on the  Right thigh(s) and the lower extremity is prepped and draped in the usual sterile fashion. Time out is performed by the operating team and then the  Right lower extremity is wrapped in Esmarch, knee flexed and the tourniquet inflated to 300 mmHg.       A midline incision is made with a ten blade through the subcutaneous tissue to the level of the extensor mechanism. A fresh blade is used to make a medial parapatellar arthrotomy. Soft tissue over the proximal medial tibia is subperiosteally elevated to the joint line with a knife and into the semimembranosus bursa with a Cobb elevator. Soft tissue over the proximal lateral tibia is elevated with  attention being paid to avoiding the patellar tendon on the tibial tubercle. The patella is everted, knee flexed 90 degrees and the ACL and PCL are removed. Findings are bone on bone medial and patellofemoral with large medial osteophytes.        The drill is used to create a starting hole in the distal femur and the canal is thoroughly irrigated with sterile saline to remove the fatty contents. The 5 degree Right  valgus alignment guide is placed into the femoral canal and the distal femoral cutting block is pinned to remove 10 mm off the distal femur. Resection is made with an oscillating saw.      The tibia is subluxed forward and the menisci are removed. The extramedullary alignment guide is placed referencing proximally at the medial aspect of the tibial tubercle and distally along the second metatarsal axis and tibial crest. The block is pinned to remove 2mm off the more deficient medial  side. Resection is made with an oscillating saw. Size 3is the most appropriate size for the tibia and the proximal tibia is prepared with the modular drill and keel punch for that size.      The femoral sizing guide is placed and size 3 is most appropriate. Rotation is marked off the epicondylar axis and confirmed by creating a rectangular flexion gap at 90 degrees. The size 3 cutting block is pinned in this rotation and the anterior, posterior and chamfer cuts are made with the oscillating saw. The intercondylar block is then placed and that cut is made.      Trial size 3 tibial component, trial  size 3 posterior stabilized femur and a 10  mm posterior stabilized rotating platform insert trial is placed. Full extension is achieved with excellent varus/valgus and anterior/posterior balance throughout full range of motion. The patella is everted and thickness measured to be 22  mm. Free hand resection is taken to 12 mm, a 35 template is placed, lug holes are drilled, trial patella is placed, and it tracks normally.  Osteophytes are removed off the posterior femur with the trial in place. All trials are removed and the cut bone surfaces prepared with pulsatile lavage. Cement is mixed and once ready for implantation, the size 3 tibial implant, size  3 posterior stabilized femoral component, and the size 35 patella are cemented in place and the patella is held with the clamp. The trial insert is placed and the knee held in full extension. The Exparel (20 ml mixed with 30 ml saline) and .25% Bupivicaine, are injected into the extensor mechanism, posterior capsule, medial and lateral gutters and subcutaneous tissues.  All extruded cement is removed and once the cement is hard the permanent 10 mm posterior stabilized rotating platform insert is placed into the tibial tray.      The wound is copiously irrigated with saline solution and the extensor mechanism closed over a hemovac drain with #1 V-loc suture. The tourniquet is released for a total tourniquet time of 33  minutes. Flexion against gravity is 130 degrees and the patella tracks normally. Subcutaneous tissue is closed with 2.0 vicryl and subcuticular with running 4.0 Monocryl. The incision is cleaned and dried and steri-strips and a bulky sterile dressing are applied. The limb is placed into a knee immobilizer and the patient is awakened and transported to recovery in stable condition.      Please note that a surgical assistant was a medical necessity for this procedure in order to perform it in a safe and expeditious manner. Surgical assistant was necessary to retract the ligaments and vital neurovascular structures to prevent injury to them and also necessary for proper positioning of the limb to allow for anatomic placement of the prosthesis.   Gus RankinFrank V. Dezmin Kittelson, MD    05/24/2014, 9:20 AM

## 2014-05-24 NOTE — Evaluation (Signed)
Physical Therapy Evaluation Patient Details Name: Lori CardGail W Upham MRN: [id number] DOB: 11/13/1941 Today's Date: 05/24/2014   History of Present Illness  Pt is a 73 year old female s/p R TKA  Clinical Impression  Pt is s/p R TKA resulting in the deficits listed below (see PT Problem List).  Pt will benefit from skilled PT to increase their independence and safety with mobility to allow discharge to the venue listed below.  Pt agreeable to mobilize POD #0 and tolerate short distance ambulation.  Pt plans to d/c to SNF.     Follow Up Recommendations SNF    Equipment Recommendations  None recommended by PT    Recommendations for Other Services       Precautions / Restrictions Precautions Precautions: Knee Required Braces or Orthoses: Knee Immobilizer - Right Knee Immobilizer - Right: Discontinue once straight leg raise with < 10 degree lag Restrictions Other Position/Activity Restrictions: WBAT      Mobility  Bed Mobility Overal bed mobility: Needs Assistance Bed Mobility: Supine to Sit;Sit to Supine     Supine to sit: Min assist Sit to supine: Min assist   General bed mobility comments: assist for R LE  Transfers Overall transfer level: Needs assistance Equipment used: Rolling walker (2 wheeled) Transfers: Sit to/from Stand Sit to Stand: Min assist         General transfer comment: verbal cues for safe technique including UE and LE placement  Ambulation/Gait Ambulation/Gait assistance: Min guard Ambulation Distance (Feet): 40 Feet Assistive device: Rolling walker (2 wheeled) Gait Pattern/deviations: Step-to pattern;Antalgic;Trunk flexed Gait velocity: decr   General Gait Details: verbal cues for sequence, RW distance, step length, WBing through UEs to assist with pain control  Stairs            Wheelchair Mobility    Modified Rankin (Stroke Patients Only)       Balance                                             Pertinent  Vitals/Pain Pt reports good pain control, activity to tolerance, ice packs applied    Home Living Family/patient expects to be discharged to:: Skilled nursing facility Living Arrangements: Spouse/significant other                    Prior Function Level of Independence: Independent               Hand Dominance        Extremity/Trunk Assessment               Lower Extremity Assessment: RLE deficits/detail RLE Deficits / Details: fair quad contraction, unable to perform SLR, maintained KI       Communication   Communication: HOH  Cognition Arousal/Alertness: Awake/alert Behavior During Therapy: WFL for tasks assessed/performed Overall Cognitive Status: Within Functional Limits for tasks assessed                      General Comments      Exercises        Assessment/Plan    PT Assessment Patient needs continued PT services  PT Diagnosis Difficulty walking;Acute pain   PT Problem List Decreased strength;Decreased knowledge of use of DME;Decreased range of motion;Decreased mobility;Decreased knowledge of precautions;Pain  PT Treatment Interventions Functional mobility training;Gait training;DME instruction;Patient/family education;Therapeutic activities;Therapeutic exercise   PT Goals (Current  goals can be found in the Care Plan section) Acute Rehab PT Goals PT Goal Formulation: With patient Time For Goal Achievement: 05/29/14 Potential to Achieve Goals: Good    Frequency 7X/week   Barriers to discharge        Co-evaluation               End of Session Equipment Utilized During Treatment: Right knee immobilizer Activity Tolerance: Patient tolerated treatment well Patient left: in bed;with call bell/phone within reach Nurse Communication: Mobility status         Time: [id number] PT Time Calculation (min): 17 min   Charges:   PT Evaluation $Initial PT Evaluation Tier I: 1 Procedure PT Treatments $Gait Training: 8-22  mins   PT G Codes:          Liev Brockbank,KATHrine E 05/24/2014, 5:24 PM Zenovia JarredKati Bach Rocchi, PT, DPT 05/24/2014 Pager: [phone]

## 2014-05-24 NOTE — Anesthesia Preprocedure Evaluation (Addendum)
Anesthesia Evaluation  Patient identified by MRN, date of birth, ID band Patient awake    Reviewed: Allergy & Precautions, H&P , NPO status , Patient's Chart, lab work & pertinent test results  Airway Mallampati: II TM Distance: >3 FB Neck ROM: Full    Dental  (+) Dental Advisory Given   Pulmonary neg pulmonary ROS,  breath sounds clear to auscultation        Cardiovascular negative cardio ROS  Rhythm:Regular Rate:Normal     Neuro/Psych negative neurological ROS  negative psych ROS   GI/Hepatic negative GI ROS, Neg liver ROS,   Endo/Other  negative endocrine ROSHypothyroidism   Renal/GU negative Renal ROS     Musculoskeletal negative musculoskeletal ROS (+)   Abdominal   Peds  Hematology negative hematology ROS (+)   Anesthesia Other Findings   Reproductive/Obstetrics negative OB ROS                           Anesthesia Physical Anesthesia Plan  ASA: II  Anesthesia Plan: Spinal   Post-op Pain Management:    Induction:   Airway Management Planned:   Additional Equipment:   Intra-op Plan:   Post-operative Plan:   Informed Consent: I have reviewed the patients History and Physical, chart, labs and discussed the procedure including the risks, benefits and alternatives for the proposed anesthesia with the patient or authorized representative who has indicated his/her understanding and acceptance.   Dental advisory given  Plan Discussed with: CRNA  Anesthesia Plan Comments:         Anesthesia Quick Evaluation

## 2014-05-24 NOTE — Anesthesia Postprocedure Evaluation (Signed)
Anesthesia Post Note  Patient: Lori Meyers  Procedure(s) Performed: Procedure(s) (LRB): RIGHT TOTAL KNEE ARTHROPLASTY (Right)  Anesthesia type: Spinal  Patient location: PACU  Post pain: Pain level controlled  Post assessment: Post-op Vital signs reviewed  Last Vitals: BP 138/74  Pulse 72  Temp(Src) 36.5 C (Axillary)  Resp 16  Ht 5\' 3"  (1.6 m)  Wt 169 lb (76.658 kg)  BMI 29.94 kg/m2  SpO2 99%  Post vital signs: Reviewed  Level of consciousness: sedated  Complications: No apparent anesthesia complications

## 2014-05-24 NOTE — Progress Notes (Signed)
Clinical Social Work Department BRIEF PSYCHOSOCIAL ASSESSMENT 05/24/2014  Patient:  Lori Meyers, Lori Meyers     Account Number:  [id number]     Admit date:  05/24/2014  Clinical Social Worker:  Lacie Scotts  Date/Time:  05/24/2014 02:48 PM  Referred by:  Physician  Date Referred:  05/24/2014 Referred for  SNF Placement   Other Referral:   Interview type:  Patient Other interview type:    PSYCHOSOCIAL DATA Living Status:  HUSBAND Admitted from facility:   Level of care:   Primary support name:  Lori Meyers Primary support relationship to patient:  SPOUSE Degree of support available:   unclear    CURRENT CONCERNS Current Concerns  Post-Acute Placement   Other Concerns:    SOCIAL WORK ASSESSMENT / PLAN Pt is a 73 yr old female living at home prior to hospitalization. CSW met with pt to assist with d/c planning. This is a planned admission. Pt has made prior arrangements to have ST Rehab at [facility] following hospital d/c. CSW has contacted SNF and d/c plans have been confirmed. CSW will continue to follow to assist with d/c planning.   Assessment/plan status:  Psychosocial Support/Ongoing Assessment of Needs Other assessment/ plan:   Information/referral to community resources:   Insurance coverage for SNF and ambulance transport reviewed.    PATIENT'S/FAMILY'S RESPONSE TO PLAN OF CARE: Pt is alert and pain is controlled. She is looking forward to rehab at [facility].     Werner Lean LCSW [phone]

## 2014-05-24 NOTE — Transfer of Care (Signed)
Immediate Anesthesia Transfer of Care Note  Patient: Lori Meyers  Procedure(s) Performed: Procedure(s): RIGHT TOTAL KNEE ARTHROPLASTY (Right)  Patient Location: PACU  Anesthesia Type:Spinal  Level of Consciousness: awake, alert , oriented and patient cooperative  Airway & Oxygen Therapy: Patient Spontanous Breathing and Patient connected to face mask oxygen  Post-op Assessment: Report given to PACU RN and Post -op Vital signs reviewed and stable  Post vital signs: Reviewed and stable  Complications: No apparent anesthesia complications

## 2014-05-24 NOTE — Interval H&P Note (Signed)
History and Physical Interval Note:  05/24/2014 7:08 AM  Lori Meyers CardGail W Granville  has presented today for surgery, with the diagnosis of OA RIGHT KNEE   The various methods of treatment have been discussed with the patient and family. After consideration of risks, benefits and other options for treatment, the patient has consented to  Procedure(s): RIGHT TOTAL KNEE ARTHROPLASTY (Right) as a surgical intervention .  The patient's history has been reviewed, patient examined, no change in status, stable for surgery.  I have reviewed the patient's chart and labs.  Questions were answered to the patient's satisfaction.     Loanne DrillingALUISIO,Elenna Spratling V

## 2014-05-25 ENCOUNTER — Encounter (HOSPITAL_COMMUNITY): Payer: Self-pay | Admitting: Orthopedic Surgery

## 2014-05-25 LAB — BASIC METABOLIC PANEL
BUN: 9 mg/dL (ref 6–23)
CO2: 24 meq/L (ref 19–32)
Calcium: 8.7 mg/dL (ref 8.4–10.5)
Chloride: 105 mEq/L (ref 96–112)
Creatinine, Ser: 0.54 mg/dL (ref 0.50–1.10)
GFR calc non Af Amer: >90 mL/min (ref >90)
Glucose, Bld: 144 mg/dL — ABNORMAL HIGH (ref 70–99)
POTASSIUM: 3.9 meq/L (ref 3.7–5.3)
Sodium: 141 mEq/L (ref 137–147)

## 2014-05-25 LAB — CBC
HCT: 34.5 % — ABNORMAL LOW (ref 36.0–46.0)
Hemoglobin: 11.6 g/dL — ABNORMAL LOW (ref 12.0–15.0)
MCH: 29.4 pg (ref 26.0–34.0)
MCHC: 33.6 g/dL (ref 30.0–36.0)
MCV: 87.6 fL (ref 78.0–100.0)
PLATELETS: 169 10*3/uL (ref 150–400)
RBC: 3.94 MIL/uL (ref 3.87–5.11)
RDW: 13.1 % (ref 11.5–15.5)
WBC: 10.6 10*3/uL — ABNORMAL HIGH (ref 4.0–10.5)

## 2014-05-25 NOTE — Progress Notes (Signed)
   Subjective: 1 Day Post-Op Procedure(s) (LRB): RIGHT TOTAL KNEE ARTHROPLASTY (Right) Patient reports pain as mild.   Patient seen in rounds with Dr. Lequita HaltAluisio.  Sitting up eating breakfast. Patient is well, but has had some minor complaints of pain in the knee, requiring pain medications We will start therapy today.  [facility].  Objective: Vital signs in last 24 hours: Temp:  [94.5 F (34.7 C)-98.3 F (36.8 C)] 97.7 F (36.5 C) (06/16 0413) Pulse Rate:  [61-89] 73 (06/16 0413) Resp:  [7-19] 12 (06/16 0756) BP: (91-144)/(51-80) 115/68 mmHg (06/16 0413) SpO2:  [93 %-100 %] 98 % (06/16 0756) Weight:  [76.658 kg (169 lb)] 76.658 kg (169 lb) (06/15 1230)  Intake/Output from previous day:  Intake/Output Summary (Last 24 hours) at 05/25/14 0828 Last data filed at 05/25/14 0600  Gross per 24 hour  Intake 3577.5 ml  Output   3170 ml  Net  407.5 ml    Intake/Output this shift:    Labs:  Recent Labs  05/25/14 0457  HGB 11.6*    Recent Labs  05/25/14 0457  WBC 10.6*  RBC 3.94  HCT 34.5*  PLT 169    Recent Labs  05/25/14 0457  NA 141  K 3.9  CL 105  CO2 24  BUN 9  CREATININE 0.54  GLUCOSE 144*  CALCIUM 8.7   No results found for this basename: LABPT, INR,  in the last 72 hours  EXAM General - Patient is Alert, Appropriate and Oriented Extremity - Neurovascular intact Sensation intact distally Dressing - dressing C/D/I Motor Function - intact, moving foot and toes well on exam.  Hemovac pulled without difficulty.  Past Medical History  Diagnosis Date  . Hypercholesteremia     under control  . Hypothyroidism     thyroidectomy-under control  . Arthritis     arms, knees  . Bursitis of left knee     Assessment/Plan: 1 Day Post-Op Procedure(s) (LRB): RIGHT TOTAL KNEE ARTHROPLASTY (Right) Principal Problem:   OA (osteoarthritis) of knee  Estimated body mass index is 29.94 kg/(m^2) as calculated from the  following:   Height as of this encounter: 5\' 3"  (1.6 m).   Weight as of this encounter: 76.658 kg (169 lb). Advance diet Up with therapy [facility]charge [facility]   DVT Prophylaxis - Xarelto Weight-Bearing as tolerated to right leg D/C O2 and Pulse OX and try on Room Air  Avel Peacerew Perkins, PA-C Orthopaedic Surgery 05/25/2014, 8:28 AM

## 2014-05-25 NOTE — Progress Notes (Signed)
Physical Therapy Treatment Patient Details Name: Lori Meyers MRN: [id number] DOB: 08/11/1941 Today's Date: 05/25/2014    History of Present Illness Pt is a 73 year old female s/p R TKA    PT Comments    POD # 1 am session.  Assisted pt out of recliner to amb in hallway then to BR to void.  Assisted pt back to bed to perfrom TKR TE's.  Pt progressing slowly and will need ST Rehab.  Follow Up Recommendations  SNF ([facility])     Equipment Recommendations       Recommendations for Other Services       Precautions / Restrictions Precautions Precautions: Knee Precaution Comments: Instructed pt on KI use for amb Required Braces or Orthoses: Knee Immobilizer - Right Knee Immobilizer - Right: Discontinue once straight leg raise with < 10 degree lag Restrictions Weight Bearing Restrictions: No Other Position/Activity Restrictions: WBAT    Mobility  Bed Mobility               General bed mobility comments: pt up in recliner upon entering room  Transfers Overall transfer level: Needs assistance Equipment used: Rolling walker (2 wheeled) Transfers: Sit to/from Stand Sit to Stand: Min assist         General transfer comment: verbal cues for safe technique including UE and LE placement  Ambulation/Gait Ambulation/Gait assistance: Min assist;Min guard Ambulation Distance (Feet): 38 Feet Assistive device: Rolling walker (2 wheeled) Gait Pattern/deviations: Step-to pattern;Decreased stance time - right Gait velocity: decr   General Gait Details: verbal cues for sequence, RW distance, step length   pt also required increased time   Stairs            Wheelchair Mobility    Modified Rankin (Stroke Patients Only)       Balance Overall balance assessment: Needs assistance Sitting-balance support: No upper extremity supported;Feet supported Sitting balance-Leahy Scale: Good     Standing balance support: Single extremity supported;Bilateral upper  extremity supported;During functional activity Standing balance-Leahy Scale: Fair                      Cognition Arousal/Alertness: Awake/alert Behavior During Therapy: WFL for tasks assessed/performed Overall Cognitive Status: Within Functional Limits for tasks assessed                      Exercises   Total Knee Replacement TE's 10 reps B LE ankle pumps 10 reps towel squeezes 10 reps knee presses 10 reps heel slides  10 reps SAQ's 10 reps SLR's 10 reps ABD Followed by ICE     General Comments        Pertinent Vitals/Pain C/o 7/10 pain ICE applied    Home Living Family/patient expects to be discharged to:: Skilled nursing facility Living Arrangements: Spouse/significant other             Additional Comments: [facility]    Prior Function Level of Independence: Independent          PT Goals (current goals can now be found in the care plan section) Acute Rehab PT Goals Patient Stated Goal: go home Progress towards PT goals: Progressing toward goals    Frequency  7X/week    PT Plan      Co-evaluation             End of Session Equipment Utilized During Treatment: Right knee immobilizer Activity Tolerance: Patient tolerated treatment  well Patient left: in chair;with call bell/phone within reach     Time: 0955-1025 PT Time Calculation (min): 30 min  Charges:  $Gait Training: 8-22 mins $Therapeutic Exercise: 8-22 mins                    G Codes:      Felecia ShellingLori Haroldine Redler  PTA [facility]  Acute  Rehab Pager      [phone]

## 2014-05-25 NOTE — Progress Notes (Signed)
Physical Therapy Treatment Patient Details Name: Lori Meyers MRN: [id number] DOB: 11/06/1941 Today's Date: 05/25/2014    History of Present Illness Pt is a 73 year old female s/p R TKA    PT Comments    POD # 1 pm session.  Assisted pt OOB to amb to BR to void then amb short distance in hallway.  Increased c/o fatigue.  Assisted back to bed for CPM.  Follow Up Recommendations  [facility]     Equipment Recommendations       Recommendations for Other Services       Precautions / Restrictions Precautions Precautions: Knee Precaution Comments: Instructed pt on KI use for amb Required Braces or Orthoses: Knee Immobilizer - Right Knee Immobilizer - Right: Discontinue once straight leg raise with < 10 degree lag Restrictions Weight Bearing Restrictions: No Other Position/Activity Restrictions: WBAT    Mobility  Bed Mobility Overal bed mobility: Needs Assistance Bed Mobility: Supine to Sit;Sit to Supine     Supine to sit: Min assist Sit to supine: Min assist   General bed mobility comments: increased time and 25% VC's on proper tech to decrease pain  Transfers Overall transfer level: Needs assistance Equipment used: Rolling walker (2 wheeled) Transfers: Sit to/from Stand Sit to Stand: Min assist         General transfer comment: verbal cues for safe technique including UE and LE placement  Ambulation/Gait Ambulation/Gait assistance: Min assist;Min guard Ambulation Distance (Feet): 27 Feet (decreased amb distance due to increased c/o pain) Assistive device: Rolling walker (2 wheeled) Gait Pattern/deviations: Step-to pattern;Decreased stance time - right Gait velocity: decr   General Gait Details: verbal cues for sequence, RW distance, step length   pt also required increased time   Stairs            Wheelchair Mobility    Modified Rankin (Stroke Patients Only)       Balance Overall balance assessment: Needs assistance Sitting-balance  support: No upper extremity supported;Feet supported Sitting balance-Leahy Scale: Good     Standing balance support: Single extremity supported;Bilateral upper extremity supported;During functional activity Standing balance-Leahy Scale: Fair                      Cognition Arousal/Alertness: Awake/alert Behavior During Therapy: WFL for tasks assessed/performed Overall Cognitive Status: Within Functional Limits for tasks assessed                      Exercises      General Comments        Pertinent Vitals/Pain C/o 6/10 meds given ICE applied    Home Living Family/patient expects to be discharged to:: Skilled nursing facility Living Arrangements: Spouse/significant other             Additional Comments: [facility]    Prior Function Level of Independence: Independent          PT Goals (current goals can now be found in the care plan section) Acute Rehab PT Goals Patient Stated Goal: go home Progress towards PT goals: Progressing toward goals    Frequency  7X/week    PT Plan      Co-evaluation             End of Session Equipment Utilized During Treatment: Right knee immobilizer;Gait belt Activity Tolerance: Patient tolerated treatment well Patient left: in bed;with call bell/phone within reach     Time: [id number]  PT Time Calculation (min): 15 min  Charges:  $Gait Training: 8-22 mins                     G Codes:      Felecia ShellingLori Kropski  PTA [facility]  Acute  Rehab Pager      [phone]

## 2014-05-25 NOTE — Progress Notes (Signed)
[id number]/Rhonda Davis, RN, BSN, CCM  [phone]  Chart Reviewed for discharge and hospital needs.  Discharge needs at time of review: None present will follow for needs.  Review of patient progress due on [id number].  

## 2014-05-25 NOTE — Discharge Instructions (Addendum)
° °Dr. Frank Aluisio °Total Joint Specialist °[facility] °[address] °[place], [place] [address] °[phone] ° °TOTAL KNEE REPLACEMENT POSTOPERATIVE DIRECTIONS ° ° ° °Knee Rehabilitation, Guidelines Following Surgery  °Results after knee surgery are often greatly improved when you follow the exercise, range of motion and muscle strengthening exercises prescribed by your doctor. Safety measures are also important to protect the knee from further injury. Any time any of these exercises cause you to have increased pain or swelling in your knee joint, decrease the amount until you are comfortable again and slowly increase them. If you have problems or questions, call your caregiver or physical therapist for advice.  ° °HOME CARE INSTRUCTIONS  °Remove items at home which could result in a fall. This includes throw rugs or furniture in walking pathways.  °Continue medications as instructed at time of discharge. °You may have some home medications which will be placed on hold until you complete the course of blood thinner medication.  °You may start showering once you are discharged home but do not submerge the incision under water. Just pat the incision dry and apply a dry gauze dressing on daily. °Walk with walker as instructed.  °You may resume a sexual relationship in one month or when given the OK by  your doctor.  °· Use walker as long as suggested by your caregivers. °· Avoid periods of inactivity such as sitting longer than an hour when not asleep. This helps prevent blood clots.  °You may put full weight on your legs and walk as much as is comfortable.  °You may return to work once you are cleared by your doctor.  °Do not drive a car for 6 weeks or until released by you surgeon.  °· Do not drive while taking narcotics.  °Wear the elastic stockings for three weeks following surgery during the day but you may remove then at night. °Make sure you keep all of your appointments after your  operation with all of your doctors and caregivers. You should call the office at the above phone number and make an appointment for approximately two weeks after the date of your surgery. °Change the dressing daily and reapply a dry dressing each time. °Please pick up a stool softener and laxative for home use as long as you are requiring pain medications. °· Continue to use ice on the knee for pain and swelling from surgery. You may notice swelling that will progress down to the foot and ankle.  This is normal after surgery.  Elevate the leg when you are not up walking on it.   °It is important for you to complete the blood thinner medication as prescribed by your doctor. °· Continue to use the breathing machine which will help keep your temperature down.  It is common for your temperature to cycle up and down following surgery, especially at night when you are not up moving around and exerting yourself.  The breathing machine keeps your lungs expanded and your temperature down. ° °RANGE OF MOTION AND STRENGTHENING EXERCISES  °Rehabilitation of the knee is important following a knee injury or an operation. After just a few days of immobilization, the muscles of the thigh which control the knee become weakened and shrink (atrophy). Knee exercises are designed to build up the tone and strength of the thigh muscles and to improve knee motion. Often times heat used for twenty to thirty minutes before working out will loosen up your tissues and help with improving the   range of motion but do not use heat for the first two weeks following surgery. These exercises can be done on a training (exercise) mat, on the floor, on a table or on a bed. Use what ever works the best and is most comfortable for you Knee exercises include:  °Leg Lifts - While your knee is still immobilized in a splint or cast, you can do straight leg raises. Lift the leg to 60 degrees, hold for 3 sec, and slowly lower the leg. Repeat 10-20 times 2-3  times daily. Perform this exercise against resistance later as your knee gets better.  °Quad and Hamstring Sets - Tighten up the muscle on the front of the thigh (Quad) and hold for 5-10 sec. Repeat this 10-20 times hourly. Hamstring sets are done by pushing the foot backward against an object and holding for 5-10 sec. Repeat as with quad sets.  °A rehabilitation program following serious knee injuries can speed recovery and prevent re-injury in the future due to weakened muscles. Contact your doctor or a physical therapist for more information on knee rehabilitation.  ° °SKILLED REHAB INSTRUCTIONS: °If the patient is transferred to a skilled rehab facility following release from the hospital, a list of the current medications will be sent to the facility for the patient to continue.  When discharged from the skilled rehab facility, please have the facility set up the patient's Home Health Physical Therapy prior to being released. Also, the skilled facility will be responsible for providing the patient with their medications at time of release from the facility to include their pain medication, the muscle relaxants, and their blood thinner medication. If the patient is still at the rehab facility at time of the two week follow up appointment, the skilled rehab facility will also need to assist the patient in arranging follow up appointment in our office and any transportation needs. ° °MAKE SURE YOU:  °Understand these instructions.  °Will watch your condition.  °Will get help right away if you are not doing well or get worse.  ° ° °Pick up stool softner and laxative for home. °Do not submerge incision under water. °May shower. °Continue to use ice for pain and swelling from surgery. ° °Take Xarelto for two and a half more weeks, then discontinue Xarelto. °Once the patient has completed the blood thinner regimen, then take a Baby 81 mg Aspirin daily for three more weeks. ° °When discharged from the skilled rehab  facility, please have the facility set up the patient's Home Health Physical Therapy prior to being released.  Also provide the patient with their medications at time of release from the facility to include their pain medication, the muscle relaxants, and their blood thinner medication.  If the patient is still at the rehab facility at time of follow up appointment, please also assist the patient in arranging follow up appointment in our office and any transportation needs. ° ° °Information on my medicine - XARELTO® (Rivaroxaban) ° °This medication education was reviewed with me or my healthcare representative as part of my discharge preparation.  The pharmacist that spoke with me during my hospital stay was:  Absher, Randall K, RPH ° °Why was Xarelto® prescribed for you? °Xarelto® was prescribed for you to reduce the risk of blood clots forming after orthopedic surgery. The medical term for these abnormal blood clots is venous thromboembolism (VTE). ° °What do you need to know about xarelto® ? °Take your Xarelto® ONCE DAILY at the same time every day. °  You may take it either with or without food. ° °If you have difficulty swallowing the tablet whole, you may crush it and mix in applesauce just prior to taking your dose. ° °Take Xarelto® exactly as prescribed by your doctor and DO NOT stop taking Xarelto® without talking to the doctor who prescribed the medication.  Stopping without other VTE prevention medication to take the place of Xarelto® may increase your risk of developing a clot. ° °After discharge, you should have regular check-up appointments with your healthcare provider that is prescribing your Xarelto®.   ° °What do you do if you miss a dose? °If you miss a dose, take it as soon as you remember on the same day then continue your regularly scheduled once daily regimen the next day. Do not take two doses of Xarelto® on the same day.  ° °Important Safety Information °A possible side effect of Xarelto® is  bleeding. You should call your healthcare provider right away if you experience any of the following: °  Bleeding from an injury or your nose that does not stop. °  Unusual colored urine (red or dark brown) or unusual colored stools (red or black). °  Unusual bruising for unknown reasons. °  A serious fall or if you hit your head (even if there is no bleeding). ° °Some medicines may interact with Xarelto® and might increase your risk of bleeding while on Xarelto®. To help avoid this, consult your healthcare provider or pharmacist prior to using any new prescription or non-prescription medications, including herbals, vitamins, non-steroidal anti-inflammatory drugs (NSAIDs) and supplements. ° °This website has more information on Xarelto®: www.xarelto.com. ° ° °

## 2014-05-25 NOTE — Evaluation (Signed)
Occupational Therapy Evaluation Patient Details Name: Lori Meyers MRN: [id number] DOB: 03/26/1941 Today's Date: 05/25/2014    History of Present Illness Pt is a 73 year old female s/p R TKA   Clinical Impression   Pt demonstrates decline in function and safety with ADLs and ADL mobility with decreased balance and endurance. Pt would benefit from acute OT services to address impairments to increase level of function and safety    Follow Up Recommendations  SNF;Supervision/Assistance - 24 hour    Equipment Recommendations  None recommended by OT;Other (comment) (TBD at next venue of care)    Recommendations for Other Services       Precautions / Restrictions Precautions Precautions: Knee Required Braces or Orthoses: Knee Immobilizer - Right Knee Immobilizer - Right: Discontinue once straight leg raise with < 10 degree lag Restrictions Weight Bearing Restrictions: No Other Position/Activity Restrictions: WBAT      Mobility Bed Mobility               General bed mobility comments: pt up in recliner upon entering room  Transfers Overall transfer level: Needs assistance Equipment used: Rolling walker (2 wheeled) Transfers: Sit to/from Stand Sit to Stand: Min assist         General transfer comment: verbal cues for safe technique including UE and LE placement    Balance Overall balance assessment: Needs assistance Sitting-balance support: No upper extremity supported;Feet supported Sitting balance-Leahy Scale: Good     Standing balance support: Single extremity supported;Bilateral upper extremity supported;During functional activity Standing balance-Leahy Scale: Fair                              ADL Overall ADL's : Needs assistance/impaired     Grooming: Wash/dry hands;Wash/dry face;Minimal assistance;Standing   Upper Body Bathing: Supervision/ safety;Set up;Sitting   Lower Body Bathing: Maximal assistance   Upper Body Dressing :  Supervision/safety;Set up;Sitting   Lower Body Dressing: Maximal assistance   Toilet Transfer: Minimal assistance;RW;Comfort height toilet;Grab bars;Cueing for safety   Toileting- Clothing Manipulation and Hygiene: Moderate assistance;Sit to/from stand       Functional mobility during ADLs: Minimal assistance General ADL Comments: verbal cues for safe technique including UE and LE placement     Vision  wears glasses                   Perception Perception Perception Tested?: No   Praxis Praxis Praxis tested?: Not tested    Pertinent Vitals/Pain 3/10 R knee pain, VSS     Hand Dominance Right   Extremity/Trunk Assessment Upper Extremity Assessment Upper Extremity Assessment: Overall WFL for tasks assessed   Lower Extremity Assessment Lower Extremity Assessment: Defer to PT evaluation   Cervical / Trunk Assessment Cervical / Trunk Assessment: Normal   Communication Communication Communication: HOH   Cognition Arousal/Alertness: Awake/alert Behavior During Therapy: WFL for tasks assessed/performed Overall Cognitive Status: Within Functional Limits for tasks assessed                     General Comments   pt pleasant and cooperative                 Home Living Family/patient expects to be discharged to:: Skilled nursing facility Living Arrangements: Spouse/significant other                               Additional Comments:  [facility]      Prior Functioning/Environment Level of Independence: Independent             OT Diagnosis: Acute pain   OT Problem List: Decreased knowledge of use of DME or AE;Decreased activity tolerance;Impaired balance (sitting and/or standing);Pain   OT Treatment/Interventions: Self-care/ADL training;Therapeutic exercise;Patient/family education;Neuromuscular education;Balance training;Therapeutic activities;DME and/or AE instruction    OT  Goals(Current goals can be found in the care plan section) Acute Rehab OT Goals Patient Stated Goal: go home OT Goal Formulation: With patient Time For Goal Achievement: 06/01/14 Potential to Achieve Goals: Good ADL Goals Pt Will Perform Grooming: with min guard assist;standing Pt Will Perform Lower Body Bathing: with mod assist;sitting/lateral leans;sit to/from stand Pt Will Perform Lower Body Dressing: with mod assist;sit to/from stand;sitting/lateral leans Pt Will Transfer to Toilet: with min guard assist;ambulating;grab bars (3 in 1 over toilet) Pt Will Perform Toileting - Clothing Manipulation and hygiene: with min assist;sit to/from stand  OT Frequency: Min 2X/week   Barriers to D/C: Decreased caregiver support  pt plans to d/c to SNF for short term rehab                     End of Session Equipment Utilized During Treatment: Rolling walker;Other (comment) (3 in 1) CPM Right Knee CPM Right Knee: Off  Activity Tolerance: Patient tolerated treatment well Patient left: in chair;with call bell/phone within reach   Time: [id number] OT Time Calculation (min): 22 min Charges:  OT General Charges $OT Visit: 1 Procedure OT Evaluation $Initial OT Evaluation Tier I: 1 Procedure OT Treatments $Therapeutic Activity: 8-22 mins G-Codes:    Galen ManilaSpencer, Denise Jeanette 05/25/2014, 1:18 PM

## 2014-05-26 LAB — BASIC METABOLIC PANEL
BUN: 12 mg/dL (ref 6–23)
CHLORIDE: 101 meq/L (ref 96–112)
CO2: 25 meq/L (ref 19–32)
CREATININE: 0.53 mg/dL (ref 0.50–1.10)
Calcium: 9.1 mg/dL (ref 8.4–10.5)
GFR calc Af Amer: >90 mL/min (ref >90)
GFR calc non Af Amer: >90 mL/min (ref >90)
Glucose, Bld: 123 mg/dL — ABNORMAL HIGH (ref 70–99)
POTASSIUM: 4 meq/L (ref 3.7–5.3)
Sodium: 138 mEq/L (ref 137–147)

## 2014-05-26 LAB — CBC
HEMATOCRIT: 34.5 % — AB (ref 36.0–46.0)
HEMOGLOBIN: 12 g/dL (ref 12.0–15.0)
MCH: 30.5 pg (ref 26.0–34.0)
MCHC: 34.8 g/dL (ref 30.0–36.0)
MCV: 87.6 fL (ref 78.0–100.0)
Platelets: 179 10*3/uL (ref 150–400)
RBC: 3.94 MIL/uL (ref 3.87–5.11)
RDW: 13.2 % (ref 11.5–15.5)
WBC: 12.3 10*3/uL — AB (ref 4.0–10.5)

## 2014-05-26 NOTE — Progress Notes (Signed)
Physical Therapy Treatment Patient Details Name: Lori Meyers MRN: [id number] DOB: 12/29/1940 Today's Date: 05/26/2014    History of Present Illness Pt is a 73 year old female s/p R TKA    PT Comments    POD # 2 pm session.  Assisted pt OOB to amb in hallway then to BR then back to bed to perform TKR TE's followed by ICE.  Pt progressing slowly and plans to D/C to SNF for ST Rehab.  Follow Up Recommendations  SNF ([facility])     Equipment Recommendations       Recommendations for Other Services       Precautions / Restrictions Precautions Precautions: Knee Restrictions Weight Bearing Restrictions: No Other Position/Activity Restrictions: WBAT    Mobility  Bed Mobility Overal bed mobility: Needs Assistance Bed Mobility: Supine to Sit     Supine to sit: Min assist     General bed mobility comments: increased time and 25% VC's on proper tech to decrease pain  Transfers Overall transfer level: Needs assistance Equipment used: Rolling walker (2 wheeled) Transfers: Sit to/from Stand Sit to Stand: Min assist         General transfer comment: verbal cues for safe technique including UE and LE placement  Ambulation/Gait Ambulation/Gait assistance: Min assist Ambulation Distance (Feet): 33 Feet Assistive device: Rolling walker (2 wheeled) Gait Pattern/deviations: Step-to pattern;Decreased stance time - right Gait velocity: decr   General Gait Details: verbal cues for sequence, RW distance, step length   pt also required increased time   Stairs            Wheelchair Mobility    Modified Rankin (Stroke Patients Only)       Balance                                    Cognition                            Exercises   Total Knee Replacement TE's 10 reps B LE ankle pumps 10 reps towel squeezes 10 reps knee presses 10 reps heel slides  10 reps SAQ's 10 reps SLR's 10 reps ABD Followed by ICE     General Comments         Pertinent Vitals/Pain C/o 5/10 pain ICE applied    Home Living                      Prior Function            PT Goals (current goals can now be found in the care plan section) Progress towards PT goals: Progressing toward goals    Frequency  7X/week    PT Plan      Co-evaluation             End of Session Equipment Utilized During Treatment: Gait belt Activity Tolerance: Patient limited by fatigue Patient left: in chair;with call bell/phone within reach     Time: 1410-1440 PT Time Calculation (min): 30 min  Charges:  $Gait Training: 8-22 mins $Therapeutic Exercise: 8-22 mins                    G Codes:      Felecia ShellingLori Kropski  PTA [facility]  Acute  Rehab Pager      [phone]

## 2014-05-26 NOTE — Progress Notes (Signed)
Physical Therapy Treatment Patient Details Name: Lori Meyers MRN: [id number] DOB: 10/23/1941 Today's Date: 05/26/2014    History of Present Illness Pt is a 73 year old female s/p R TKA    PT Comments    POD # 2 am session.  Assisted pt OOB to amb in hallway then amb to BR.  Assited to recliner to perform TKR TE's followed by ICE.  Follow Up Recommendations  SNF ([facility])     Equipment Recommendations       Recommendations for Other Services       Precautions / Restrictions Precautions Precautions: Knee Restrictions Weight Bearing Restrictions: No Other Position/Activity Restrictions: WBAT    Mobility  Bed Mobility Overal bed mobility: Needs Assistance Bed Mobility: Supine to Sit     Supine to sit: Min assist     General bed mobility comments: increased time and 25% VC's on proper tech to decrease pain  Transfers Overall transfer level: Needs assistance Equipment used: Rolling walker (2 wheeled) Transfers: Sit to/from Stand Sit to Stand: Min assist         General transfer comment: verbal cues for safe technique including UE and LE placement  Ambulation/Gait Ambulation/Gait assistance: Min assist Ambulation Distance (Feet): 33 Feet Assistive device: Rolling walker (2 wheeled) Gait Pattern/deviations: Step-to pattern;Decreased stance time - right Gait velocity: decr   General Gait Details: verbal cues for sequence, RW distance, step length   pt also required increased time   Stairs            Wheelchair Mobility    Modified Rankin (Stroke Patients Only)       Balance                                    Cognition                            Exercises   Total Knee Replacement TE's 10 reps B LE ankle pumps 10 reps towel squeezes 10 reps knee presses 10 reps heel slides  10 reps SAQ's 10 reps SLR's 10 reps ABD Followed by ICE     General Comments        Pertinent Vitals/Pain C/o 7/10  ICE  applied    Home Living                      Prior Function            PT Goals (current goals can now be found in the care plan section) Progress towards PT goals: Progressing toward goals    Frequency  7X/week    PT Plan      Co-evaluation             End of Session Equipment Utilized During Treatment: Gait belt Activity Tolerance: Patient limited by fatigue Patient left: in chair;with call bell/phone within reach     Time: 0935-1000 PT Time Calculation (min): 25 min  Charges:  $Gait Training: 8-22 mins $Therapeutic Exercise: 8-22 mins                    G Codes:      Felecia ShellingLori Niasha Devins  PTA [facility]  Acute  Rehab Pager      [phone]

## 2014-05-26 NOTE — Progress Notes (Signed)
Patient reported feeling intermittent chest "tightness" during the night, reports it was never severe or painful but "new".  Patient does not appear to be in any distress and denies any pain or tightness this AM.  No shortness of breath.  Vital signs WNL.  EKG performed.  Patient educated of the importance of informing their nurse/MD of any new pain or tightness.  Will talk to rounding MD or PA this AM.  Will continue to monitor.

## 2014-05-27 ENCOUNTER — Other Ambulatory Visit: Payer: Self-pay | Admitting: *Deleted

## 2014-05-27 LAB — CBC
HEMATOCRIT: 34.2 % — AB (ref 36.0–46.0)
Hemoglobin: 11.7 g/dL — ABNORMAL LOW (ref 12.0–15.0)
MCH: 30 pg (ref 26.0–34.0)
MCHC: 34.2 g/dL (ref 30.0–36.0)
MCV: 87.7 fL (ref 78.0–100.0)
Platelets: 159 10*3/uL (ref 150–400)
RBC: 3.9 MIL/uL (ref 3.87–5.11)
RDW: 13.3 % (ref 11.5–15.5)
WBC: 9.1 10*3/uL (ref 4.0–10.5)

## 2014-05-27 MED ORDER — TRAMADOL HCL 50 MG PO TABS: 50.0000 mg | ORAL_TABLET | Freq: Four times a day (QID) | ORAL | Status: DC | PRN

## 2014-05-27 MED ORDER — METOCLOPRAMIDE HCL 5 MG PO TABS: 5.0000 mg | ORAL_TABLET | Freq: Three times a day (TID) | ORAL | Status: DC | PRN

## 2014-05-27 MED ORDER — ACETAMINOPHEN 325 MG PO TABS: 650.0000 mg | ORAL_TABLET | Freq: Four times a day (QID) | ORAL | Status: AC | PRN

## 2014-05-27 MED ORDER — METHOCARBAMOL 500 MG PO TABS: 500.0000 mg | ORAL_TABLET | Freq: Four times a day (QID) | ORAL | Status: DC | PRN

## 2014-05-27 MED ORDER — ONDANSETRON HCL 4 MG PO TABS: 4.0000 mg | ORAL_TABLET | Freq: Four times a day (QID) | ORAL | Status: DC | PRN

## 2014-05-27 MED ORDER — DSS 100 MG PO CAPS: 100.0000 mg | ORAL_CAPSULE | Freq: Two times a day (BID) | ORAL | Status: DC

## 2014-05-27 MED ORDER — BISACODYL 10 MG RE SUPP: 10.0000 mg | Freq: Every day | RECTAL | Status: DC | PRN

## 2014-05-27 MED ORDER — OXYCODONE HCL 5 MG PO TABS: 5.0000 mg | ORAL_TABLET | ORAL | Status: DC | PRN

## 2014-05-27 MED ORDER — RIVAROXABAN 10 MG PO TABS: 10.0000 mg | ORAL_TABLET | Freq: Every day | ORAL | Status: DC

## 2014-05-27 MED ORDER — OXYCODONE HCL 5 MG PO TABS: ORAL_TABLET | ORAL | Status: DC

## 2014-05-27 MED ORDER — POLYETHYLENE GLYCOL 3350 17 G PO PACK: 17.0000 g | PACK | Freq: Every day | ORAL | Status: DC | PRN

## 2014-05-27 NOTE — Progress Notes (Signed)
   Subjective: 3 Days Post-Op Procedure(s) (LRB): RIGHT TOTAL KNEE ARTHROPLASTY (Right) Patient reports pain as mild.   Starting to feel better now Plan is to go Skilled nursing facility after hospital stay.  Objective: Vital signs in last 24 hours: Temp:  [97.6 F (36.4 C)-98.2 F (36.8 C)] 98 F (36.7 C) (06/18 0553) Pulse Rate:  [70-73] 72 (06/18 0553) Resp:  [16-18] 16 (06/18 0553) BP: (122-145)/(72-74) 128/73 mmHg (06/18 0553) SpO2:  [93 %-97 %] 93 % (06/18 0553)  Intake/Output from previous day:  Intake/Output Summary (Last 24 hours) at 05/27/14 0705 Last data filed at 05/26/14 1825  Gross per 24 hour  Intake    240 ml  Output    301 ml  Net    -61 ml    Intake/Output this shift:    Labs:  Recent Labs  05/25/14 0457 05/26/14 0420 05/27/14 0524  HGB 11.6* 12.0 11.7*    Recent Labs  05/26/14 0420 05/27/14 0524  WBC 12.3* 9.1  RBC 3.94 3.90  HCT 34.5* 34.2*  PLT 179 159    Recent Labs  05/25/14 0457 05/26/14 0420  NA 141 138  K 3.9 4.0  CL 105 101  CO2 24 25  BUN 9 12  CREATININE 0.54 0.53  GLUCOSE 144* 123*  CALCIUM 8.7 9.1   No results found for this basename: LABPT, INR,  in the last 72 hours  EXAM General - Patient is Alert, Appropriate and Oriented Extremity - Neurologically intact Neurovascular intact Incision: dressing C/D/I No cellulitis present Compartment soft Dressing/Incision - clean, dry, no drainage Motor Function - intact, moving foot and toes well on exam.   Past Medical History  Diagnosis Date  . Hypercholesteremia     under control  . Hypothyroidism     thyroidectomy-under control  . Arthritis     arms, knees  . Bursitis of left knee     Assessment/Plan: 3 Days Post-Op Procedure(s) (LRB): RIGHT TOTAL KNEE ARTHROPLASTY (Right) Principal Problem:   OA (osteoarthritis) of knee   Discharge to SNF  DVT Prophylaxis - Xarelto Weight-Bearing as tolerated to right leg  Lori Meyers V 05/27/2014, 7:05 AM

## 2014-05-27 NOTE — Progress Notes (Signed)
LATE ENTRY NOTE Date of Service of Visit - 05-26-2014    Subjective: 2 Days Post-Op Procedure(s) (LRB): RIGHT TOTAL KNEE ARTHROPLASTY (Right) Patient reports pain as mild.   Patient seen in rounds for Dr. Lequita HaltAluisio. Patient is well, but has had some minor complaints of pain in the knee, requiring pain medications Plan is to go Skilled nursing facility after hospital stay.  Objective: Vital signs in last 24 hours: Temp:  [97.6 F (36.4 C)-98.2 F (36.8 C)] 98 F (36.7 C) (06/18 0553) Pulse Rate:  [70-73] 72 (06/18 0553) Resp:  [16-18] 16 (06/18 0553) BP: (122-145)/(72-74) 128/73 mmHg (06/18 0553) SpO2:  [93 %-97 %] 93 % (06/18 0553)  Intake/Output from previous day:  Intake/Output Summary (Last 24 hours) at 05/27/14 0729 Last data filed at 05/26/14 1825  Gross per 24 hour  Intake    240 ml  Output    301 ml  Net    -61 ml    Intake/Output this shift:    Labs:  Recent Labs  05/25/14 0457 05/26/14 0420 05/27/14 0524  HGB 11.6* 12.0 11.7*    Recent Labs  05/26/14 0420 05/27/14 0524  WBC 12.3* 9.1  RBC 3.94 3.90  HCT 34.5* 34.2*  PLT 179 159    Recent Labs  05/25/14 0457 05/26/14 0420  NA 141 138  K 3.9 4.0  CL 105 101  CO2 24 25  BUN 9 12  CREATININE 0.54 0.53  GLUCOSE 144* 123*  CALCIUM 8.7 9.1   No results found for this basename: LABPT, INR,  in the last 72 hours  EXAM General - Patient is Alert and Appropriate Extremity - Neurovascular intact Sensation intact distally Dressing/Incision - clean, dry, no drainage Motor Function - intact, moving foot and toes well on exam.   Past Medical History  Diagnosis Date  . Hypercholesteremia     under control  . Hypothyroidism     thyroidectomy-under control  . Arthritis     arms, knees  . Bursitis of left knee     Assessment/Plan: 2 Days Post-Op Procedure(s) (LRB): RIGHT TOTAL KNEE ARTHROPLASTY (Right) Principal Problem:   OA (osteoarthritis) of knee  Estimated body mass index is 29.94  kg/(m^2) as calculated from the following:   Height as of this encounter: 5\' 3"  (1.6 m).   Weight as of this encounter: 76.658 kg (169 lb). Up with therapy Plan for discharge tomorrow Discharge to SNF  DVT Prophylaxis - Xarelto Weight-Bearing as tolerated to right leg  Avel Peacerew Perkins, PA-C Orthopaedic Surgery 05/27/2014, 7:29 AM

## 2014-05-27 NOTE — Progress Notes (Signed)
Clinical Social Work Department CLINICAL SOCIAL WORK PLACEMENT NOTE 05/27/2014  Patient:  Lori Meyers,Lori Meyers  Account Number:  [id number] Admit date:  05/24/2014  Clinical Social Worker:  Cori RazorJAMIE HAIDINGER, LCSW  Date/time:  05/24/2014 03:15 PM  Clinical Social Work is seeking post-discharge placement for this patient at the following level of care:   SKILLED NURSING   (*CSW will update this form in Epic as items are completed)     Patient/family provided with [facility] Department of Clinical Social Work's list of facilities offering this level of care within the geographic area requested by the patient (or if unable, by the patient's family).  05/24/2014  Patient/family informed of their freedom to choose among providers that offer the needed level of care, that participate in Medicare, Medicaid or managed care program needed by the patient, have an available bed and are willing to accept the patient.    Patient/family informed of [facility]' ownership interest in [facility], as well as of the fact that they are under no obligation to receive care at this facility.  PASARR submitted to EDS on 05/24/2014 PASARR number received on 05/24/2014  FL2 transmitted to all facilities in geographic area requested by pt/family on  05/24/2014 FL2 transmitted to all facilities within larger geographic area on   Patient informed that his/her managed care company has contracts with or will negotiate with  certain facilities, including the following:     Patient/family informed of bed offers received:  05/24/2014 [facility] Physician recommends and patient chooses bed at    [facility]  05/27/2014 Patient to be transferred to facility by [facility] Patient and family notified of transfer on 05/27/2014 Name of family member notified:  Spouse : Casimiro NeedleMichael  The following physician request were entered in Epic:   Additional Comments: Pt  / spouse [facility]. Nsg reviewed d/c summary, avs, scripts. Scripts were included in d/c packet.  Cori RazorJamie Haidinger LCSW [phone]

## 2014-05-27 NOTE — Telephone Encounter (Signed)
[facility] 

## 2014-05-27 NOTE — Progress Notes (Signed)
[facility]  D [employer] RN

## 2014-05-27 NOTE — Discharge Summary (Signed)
Physician Discharge Summary   Patient ID: TRANISHA TISSUE MRN: [id number] DOB/AGE: [birth year] 73 y.o.  Admit date: 05/24/2014 Discharge date: 05-27-2014  Primary Diagnosis:  Osteoarthritis Right knee(s)  Admission Diagnoses:  Past Medical History  Diagnosis Date  . Hypercholesteremia     under control  . Hypothyroidism     thyroidectomy-under control  . Arthritis     arms, knees  . Bursitis of left knee    Discharge Diagnoses:   Principal Problem:   OA (osteoarthritis) of knee  Estimated body mass index is 29.94 kg/(m^2) as calculated from the following:   Height as of this encounter: _0  (1.6 m).   Weight as of this encounter: 76.658 kg (169 lb).  Procedure:  Procedure(s) (LRB): RIGHT TOTAL KNEE ARTHROPLASTY (Right)   Consults: None  HPI: TALIYAH WATROUS is a 73 y.o. year old female with end stage OA of her right knee with progressively worsening pain and dysfunction. She has constant pain, with activity and at rest and significant functional deficits with difficulties even with ADLs. She has had extensive non-op management including analgesics, injections of cortisone and viscosupplements, and home exercise program, but remains in significant pain with significant dysfunction.Radiographs show bone on bone arthritis medial and patellofemoral. She presents now for right Total Knee Arthroplasty.   Laboratory Data: Admission on 05/24/2014  Component Date Value Ref Range Status  . ABO/RH(D) 05/24/2014 O POS   Final  . Antibody Screen 05/24/2014 NEG   Final  . Sample Expiration 05/24/2014 05/27/2014   Final  . ABO/RH(D) 05/24/2014 O POS   Final  . WBC 05/25/2014 10.6* 4.0 - 10.5 K/uL Final  . RBC 05/25/2014 3.94  3.87 - 5.11 MIL/uL Final  . Hemoglobin 05/25/2014 11.6* 12.0 - 15.0 g/dL Final  . HCT 05/25/2014 34.5* 36.0 - 46.0 % Final  . MCV 05/25/2014 87.6  78.0 - 100.0 fL Final  . MCH 05/25/2014 29.4  26.0 - 34.0 pg Final  . MCHC 05/25/2014 33.6  30.0 - 36.0 g/dL Final  .  RDW 05/25/2014 13.1  11.5 - 15.5 % Final  . Platelets 05/25/2014 169  150 - 400 K/uL Final  . Sodium 05/25/2014 141  137 - 147 mEq/L Final  . Potassium 05/25/2014 3.9  3.7 - 5.3 mEq/L Final  . Chloride 05/25/2014 105  96 - 112 mEq/L Final  . CO2 05/25/2014 24  19 - 32 mEq/L Final  . Glucose, Bld 05/25/2014 144* 70 - 99 mg/dL Final  . BUN 05/25/2014 9  6 - 23 mg/dL Final  . Creatinine, Ser 05/25/2014 0.54  0.50 - 1.10 mg/dL Final  . Calcium 05/25/2014 8.7  8.4 - 10.5 mg/dL Final  . GFR calc non Af Amer 05/25/2014 >90  >90 mL/min Final  . GFR calc Af Amer 05/25/2014 >90  >90 mL/min Final   Comment: (NOTE)                          The eGFR has been calculated using the CKD EPI equation.                          This calculation has not been validated in all clinical situations.                          eGFR's persistently <90 mL/min signify possible Chronic Kidney  Disease.  . WBC 05/26/2014 12.3* 4.0 - 10.5 K/uL Final  . RBC 05/26/2014 3.94  3.87 - 5.11 MIL/uL Final  . Hemoglobin 05/26/2014 12.0  12.0 - 15.0 g/dL Final  . HCT 05/26/2014 34.5* 36.0 - 46.0 % Final  . MCV 05/26/2014 87.6  78.0 - 100.0 fL Final  . MCH 05/26/2014 30.5  26.0 - 34.0 pg Final  . MCHC 05/26/2014 34.8  30.0 - 36.0 g/dL Final  . RDW 05/26/2014 13.2  11.5 - 15.5 % Final  . Platelets 05/26/2014 179  150 - 400 K/uL Final  . Sodium 05/26/2014 138  137 - 147 mEq/L Final  . Potassium 05/26/2014 4.0  3.7 - 5.3 mEq/L Final  . Chloride 05/26/2014 101  96 - 112 mEq/L Final  . CO2 05/26/2014 25  19 - 32 mEq/L Final  . Glucose, Bld 05/26/2014 123* 70 - 99 mg/dL Final  . BUN 05/26/2014 12  6 - 23 mg/dL Final  . Creatinine, Ser 05/26/2014 0.53  0.50 - 1.10 mg/dL Final  . Calcium 05/26/2014 9.1  8.4 - 10.5 mg/dL Final  . GFR calc non Af Amer 05/26/2014 >90  >90 mL/min Final  . GFR calc Af Amer 05/26/2014 >90  >90 mL/min Final   Comment: (NOTE)                          The eGFR has been calculated  using the CKD EPI equation.                          This calculation has not been validated in all clinical situations.                          eGFR's persistently <90 mL/min signify possible Chronic Kidney                          Disease.  . WBC 05/27/2014 9.1  4.0 - 10.5 K/uL Final  . RBC 05/27/2014 3.90  3.87 - 5.11 MIL/uL Final  . Hemoglobin 05/27/2014 11.7* 12.0 - 15.0 g/dL Final  . HCT 05/27/2014 34.2* 36.0 - 46.0 % Final  . MCV 05/27/2014 87.7  78.0 - 100.0 fL Final  . MCH 05/27/2014 30.0  26.0 - 34.0 pg Final  . MCHC 05/27/2014 34.2  30.0 - 36.0 g/dL Final  . RDW 05/27/2014 13.3  11.5 - 15.5 % Final  . Platelets 05/27/2014 159  150 - 400 K/uL Final  Hospital Outpatient Visit on 05/17/2014  Component Date Value Ref Range Status  . MRSA, PCR 05/17/2014 NEGATIVE  NEGATIVE Final  . Staphylococcus aureus 05/17/2014 NEGATIVE  NEGATIVE Final   Comment:                                 The Xpert SA Assay (FDA                          approved for NASAL specimens                          in patients over 30 years of age),  is one component of                          a comprehensive surveillance                          program.  Test performance has                          been validated by [facility] for patients greater                          than or equal to 30 year old.                          It is not intended                          to diagnose infection nor to                          guide or monitor treatment.  Marland Kitchen aPTT 05/17/2014 29  24 - 37 seconds Final  . WBC 05/17/2014 5.7  4.0 - 10.5 K/uL Final  . RBC 05/17/2014 4.95  3.87 - 5.11 MIL/uL Final  . Hemoglobin 05/17/2014 14.7  12.0 - 15.0 g/dL Final  . HCT 05/17/2014 42.9  36.0 - 46.0 % Final  . MCV 05/17/2014 86.7  78.0 - 100.0 fL Final  . MCH 05/17/2014 29.7  26.0 - 34.0 pg Final  . MCHC 05/17/2014 34.3  30.0 - 36.0 g/dL Final  . RDW 05/17/2014 13.1  11.5 -  15.5 % Final  . Platelets 05/17/2014 172  150 - 400 K/uL Final  . Sodium 05/17/2014 141  137 - 147 mEq/L Final  . Potassium 05/17/2014 5.1  3.7 - 5.3 mEq/L Final  . Chloride 05/17/2014 102  96 - 112 mEq/L Final  . CO2 05/17/2014 28  19 - 32 mEq/L Final  . Glucose, Bld 05/17/2014 105* 70 - 99 mg/dL Final  . BUN 05/17/2014 12  6 - 23 mg/dL Final  . Creatinine, Ser 05/17/2014 0.63  0.50 - 1.10 mg/dL Final  . Calcium 05/17/2014 9.5  8.4 - 10.5 mg/dL Final  . Total Protein 05/17/2014 7.4  6.0 - 8.3 g/dL Final  . Albumin 05/17/2014 4.2  3.5 - 5.2 g/dL Final  . AST 05/17/2014 21  0 - 37 U/L Final  . ALT 05/17/2014 26  0 - 35 U/L Final  . Alkaline Phosphatase 05/17/2014 76  39 - 117 U/L Final  . Total Bilirubin 05/17/2014 0.8  0.3 - 1.2 mg/dL Final  . GFR calc non Af Amer 05/17/2014 87* >90 mL/min Final  . GFR calc Af Amer 05/17/2014 >90  >90 mL/min Final   Comment: (NOTE)                          The eGFR has been calculated using the CKD EPI equation.                          This calculation has  not been validated in all clinical situations.                          eGFR's persistently <90 mL/min signify possible Chronic Kidney                          Disease.  Marland Kitchen Prothrombin Time 05/17/2014 12.5  11.6 - 15.2 seconds Final  . INR 05/17/2014 0.95  0.00 - 1.49 Final  . Color, Urine 05/17/2014 YELLOW  YELLOW Final  . APPearance 05/17/2014 CLEAR  CLEAR Final  . Specific Gravity, Urine 05/17/2014 1.002* 1.005 - 1.030 Final  . pH 05/17/2014 6.0  5.0 - 8.0 Final  . Glucose, UA 05/17/2014 NEGATIVE  NEGATIVE mg/dL Final  . Hgb urine dipstick 05/17/2014 NEGATIVE  NEGATIVE Final  . Bilirubin Urine 05/17/2014 NEGATIVE  NEGATIVE Final  . Ketones, ur 05/17/2014 NEGATIVE  NEGATIVE mg/dL Final  . Protein, ur 05/17/2014 NEGATIVE  NEGATIVE mg/dL Final  . Urobilinogen, UA 05/17/2014 0.2  0.0 - 1.0 mg/dL Final  . Nitrite 05/17/2014 NEGATIVE  NEGATIVE Final  . Leukocytes, UA 05/17/2014 NEGATIVE  NEGATIVE  Final   MICROSCOPIC NOT DONE ON URINES WITH NEGATIVE PROTEIN, BLOOD, LEUKOCYTES, NITRITE, OR GLUCOSE <1000 mg/dL.     X-Rays:No results found.  EKG: Orders placed during the hospital encounter of 05/24/14  . EKG 12-LEAD  . EKG 12-LEAD  . EKG 12-LEAD  . EKG 12-LEAD     Hospital Course: MONAYE BLACKIE is a 73 y.o. who was admitted to [facility]. They were brought to the operating room on 05/24/2014 and underwent Procedure(s): RIGHT TOTAL KNEE ARTHROPLASTY.  Patient tolerated the procedure well and was later transferred to the recovery room and then to the orthopaedic floor for postoperative care.  They were given PO and IV analgesics for pain control following their surgery.  They were given 24 hours of postoperative antibiotics of  Anti-infectives   Start     Dose/Rate Route Frequency Ordered Stop   05/24/14 1400  ceFAZolin (ANCEF) IVPB 2 g/50 mL premix     2 g 100 mL/hr over 30 Minutes Intravenous Every 6 hours 05/24/14 1233 05/24/14 2041   05/24/14 0626  ceFAZolin (ANCEF) IVPB 2 g/50 mL premix     2 g 100 mL/hr over 30 Minutes Intravenous On call to O.R. 05/24/14 8828 05/24/14 0825     and started on DVT prophylaxis in the form of Xarelto.   PT and OT were ordered for total joint protocol.  Discharge planning consulted to help with postop disposition and equipment needs. Social worker became involved to assist with placement into SNF  Patient had a decent night on the evening of surgery.  They started to get up OOB with therapy on day one. Hemovac drain was pulled without difficulty.  Continued to work with therapy into day two.  Dressing was changed on day two and the incision was healing well.  By day three, the patient had progressed with therapy and meeting their goals.  Incision was healing well.  Patient was seen in rounds and was ready to go to [facility].   Diet: Cardiac diet Activity:WBAT Follow-up:in 2 weeks Disposition - [place]  [facility] Discharged Condition: good   Discharge Instructions   Call MD / Call 911    Complete by:  As directed   If you experience chest pain or shortness of breath, CALL 911 and  be transported to the hospital emergency room.  If you develope a fever above 101 F, pus (white drainage) or increased drainage or redness at the wound, or calf pain, call your surgeon's office.     Change dressing    Complete by:  As directed   Change dressing daily with sterile 4 x 4 inch gauze dressing and apply TED hose. Do not submerge the incision under water.     Constipation Prevention    Complete by:  As directed   Drink plenty of fluids.  Prune juice may be helpful.  You may use a stool softener, such as Colace (over the counter) 100 mg twice a day.  Use MiraLax (over the counter) for constipation as needed.     Diet - low sodium heart healthy    Complete by:  As directed      Discharge instructions    Complete by:  As directed   Pick up stool softner and laxative for home. Do not submerge incision under water. May shower. Continue to use ice for pain and swelling from surgery.  Take Xarelto for two and a half more weeks, then discontinue Xarelto. Once the patient has completed the blood thinner regimen, then take a Baby 81 mg Aspirin daily for three more weeks.     Do not put a pillow under the knee. Place it under the heel.    Complete by:  As directed      Do not sit on low chairs, stoools or toilet seats, as it may be difficult to get up from low surfaces    Complete by:  As directed      Driving restrictions    Complete by:  As directed   No driving until released by the physician.     Increase activity slowly as tolerated    Complete by:  As directed      Lifting restrictions    Complete by:  As directed   No lifting until released by the physician.     Patient may shower    Complete by:  As directed   You may shower without a dressing once there is no drainage.  Do not wash over the wound.   If drainage remains, do not shower until drainage stops.     TED hose    Complete by:  As directed   Use stockings (TED hose) for 3 weeks on both leg(s).  You may remove them at night for sleeping.     Weight bearing as tolerated    Complete by:  As directed             Medication List    STOP taking these medications       BLUE-EMU MAXIMUM STRENGTH EX     celecoxib 200 MG capsule  Commonly known as:  CELEBREX     glucosamine-chondroitin 500-400 MG tablet     multivitamin with minerals Tabs tablet     Vitamin D (Ergocalciferol) 50000 UNITS Caps capsule  Commonly known as:  DRISDOL      TAKE these medications       acetaminophen 325 MG tablet  Commonly known as:  TYLENOL  Take 2 tablets (650 mg total) by mouth every 6 (six) hours as needed for mild pain (or Fever >/= 101).     atorvastatin 80 MG tablet  Commonly known as:  LIPITOR  Take 80 mg by mouth daily.     bisacodyl 10 MG suppository  Commonly known as:  DULCOLAX  Place 1 suppository (10 mg total) rectally daily as needed for moderate constipation.     DSS 100 MG Caps  Take 100 mg by mouth 2 (two) times daily.     ezetimibe 10 MG tablet  Commonly known as:  ZETIA  Take 10 mg by mouth every morning.     levothyroxine 50 MCG tablet  Commonly known as:  SYNTHROID, LEVOTHROID  Take 75-100 mcg by mouth daily before breakfast. Takes 2 tablets on Monday Wednesday and Friday and 1 and 1/2 the rest of the week     liothyronine 5 MCG tablet  Commonly known as:  CYTOMEL  Take 5 mcg by mouth 2 (two) times daily.     methocarbamol 500 MG tablet  Commonly known as:  ROBAXIN  Take 1 tablet (500 mg total) by mouth every 6 (six) hours as needed for muscle spasms.     metoCLOPramide 5 MG tablet  Commonly known as:  REGLAN  Take 1-2 tablets (5-10 mg total) by mouth every 8 (eight) hours as needed for nausea (if ondansetron (ZOFRAN) ineffective.).     ondansetron 4 MG tablet  Commonly known as:  ZOFRAN  Take 1  tablet (4 mg total) by mouth every 6 (six) hours as needed for nausea.     oxyCODONE 5 MG immediate release tablet  Commonly known as:  Oxy IR/ROXICODONE  Take 1-2 tablets (5-10 mg total) by mouth every 3 (three) hours as needed for moderate pain, severe pain or breakthrough pain.     polyethylene glycol packet  Commonly known as:  MIRALAX / GLYCOLAX  Take 17 g by mouth daily as needed for mild constipation.     rivaroxaban 10 MG Tabs tablet  Commonly known as:  XARELTO  - Take 1 tablet (10 mg total) by mouth daily with breakfast. Take Xarelto for two and a half more weeks, then discontinue Xarelto.  - Once the patient has completed the blood thinner regimen, then take a Baby 81 mg Aspirin daily for three more weeks.     traMADol 50 MG tablet  Commonly known as:  ULTRAM  Take 1-2 tablets (50-100 mg total) by mouth every 6 (six) hours as needed (mild to moderate pain).           Follow-up Information   Follow up with Gearlean Alf, MD. Schedule an appointment as soon as possible for a visit on 06/08/2014.   Specialty:  Orthopedic Surgery   Contact information:   [address] [place] [address] [phone]       Signed: Arlee Muslim, PA-C Orthopaedic Surgery 05/27/2014, 7:36 AM

## 2014-05-28 ENCOUNTER — Encounter: Payer: Self-pay | Admitting: Adult Health

## 2014-05-28 ENCOUNTER — Non-Acute Institutional Stay: Payer: 59 | Admitting: Adult Health

## 2014-05-28 DIAGNOSIS — D62 Acute posthemorrhagic anemia: Secondary | ICD-10-CM

## 2014-05-28 DIAGNOSIS — M1711 Unilateral primary osteoarthritis, right knee: Secondary | ICD-10-CM

## 2014-05-28 DIAGNOSIS — E7800 Pure hypercholesterolemia, unspecified: Secondary | ICD-10-CM

## 2014-05-28 DIAGNOSIS — K5900 Constipation, unspecified: Secondary | ICD-10-CM

## 2014-05-28 DIAGNOSIS — E039 Hypothyroidism, unspecified: Secondary | ICD-10-CM

## 2014-05-31 ENCOUNTER — Other Ambulatory Visit: Payer: Self-pay | Admitting: *Deleted

## 2014-05-31 MED ORDER — OXYCODONE HCL 10 MG PO TABS: ORAL_TABLET | ORAL | Status: DC

## 2014-05-31 NOTE — Telephone Encounter (Signed)
[facility] 

## 2014-06-01 ENCOUNTER — Other Ambulatory Visit: Payer: Self-pay | Admitting: *Deleted

## 2014-06-01 ENCOUNTER — Non-Acute Institutional Stay (SKILLED_NURSING_FACILITY): Payer: 59 | Admitting: Internal Medicine

## 2014-06-01 DIAGNOSIS — M1710 Unilateral primary osteoarthritis, unspecified knee: Secondary | ICD-10-CM

## 2014-06-01 DIAGNOSIS — M1711 Unilateral primary osteoarthritis, right knee: Secondary | ICD-10-CM

## 2014-06-01 DIAGNOSIS — E039 Hypothyroidism, unspecified: Secondary | ICD-10-CM | POA: Insufficient documentation

## 2014-06-01 DIAGNOSIS — E7800 Pure hypercholesterolemia, unspecified: Secondary | ICD-10-CM

## 2014-06-01 DIAGNOSIS — K5900 Constipation, unspecified: Secondary | ICD-10-CM | POA: Insufficient documentation

## 2014-06-01 MED ORDER — OXYCODONE HCL ER 10 MG PO T12A: EXTENDED_RELEASE_TABLET | ORAL | Status: DC

## 2014-06-01 NOTE — Progress Notes (Signed)
HISTORY & PHYSICAL  DATE: 06/01/2014   FACILITY: [facility]  LEVEL OF CARE: SNF (31)  ALLERGIES:  Allergies  Allergen Reactions  . Niacin And Related     Passed out    CHIEF COMPLAINT:  Manage right knee osteoarthritis, hypothyroidism and hyperlipidemia   HISTORY OF PRESENT ILLNESS: Patient is a 73 year old Caucasian female.  KNEE OSTEOARTHRITIS: Patient had a history of pain and functional disability in the knee due to end-stage osteoarthritis and has failed nonsurgical conservative treatments. Patient had worsening of pain with activity and weight bearing, pain that interfered with activities of daily living & pain with passive range of motion. Therefore patient underwent total knee arthroplasty and tolerated the procedure well. Patient is admitted to this facility for sort short-term rehabilitation. Patient complains of knee pain.  HYPOTHYROIDISM: The hypothyroidism remains stable. No complications noted from the medications presently being used.  The patient denies fatigue or constipation.  Last TSH not available.  HYPERLIPIDEMIA: No complications from the medications presently being used. Last fasting lipid panel not available.  PAST MEDICAL HISTORY :  Past Medical History  Diagnosis Date  . Hypercholesteremia     under control  . Hypothyroidism     thyroidectomy-under control  . Arthritis     arms, knees  . Bursitis of left knee     PAST SURGICAL HISTORY: Past Surgical History  Procedure Laterality Date  . Thyroidectomy  1990    "zapped"  . Finger surgery  ~2005    joint replaced on right hand middle finger  . 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 Tonsillectomy  as child  . Abdominal hysterectomy  1995    with bladder tac  . Appendectomy  5th grade  . Meniscus repair Left 2007    at office  . Total knee arthroplasty Right 05/24/2014    Procedure: RIGHT TOTAL KNEE ARTHROPLASTY;  Surgeon: Loanne DrillingFrank Aluisio  V, MD;  Location: [facility];  Service: Orthopedics;  Laterality: Right;    SOCIAL HISTORY:  reports that she has never smoked. She has never used smokeless tobacco. She reports that she does not drink alcohol or use illicit drugs.  FAMILY HISTORY: None  CURRENT MEDICATIONS: Reviewed per MAR/see medication list  REVIEW OF SYSTEMS:  See HPI otherwise 14 point ROS is negative.  PHYSICAL EXAMINATION  VS:  See VS section  GENERAL: no acute distress, moderately obese body habitus EYES: conjunctivae normal, sclerae normal, normal eye lids MOUTH/THROAT: lips without lesions,no lesions in the mouth,tongue is without lesions,uvula elevates in midline NECK: supple, trachea midline, no neck masses, no thyroid tenderness, no thyromegaly LYMPHATICS: no LAN in the neck, no supraclavicular LAN RESPIRATORY: breathing is even & unlabored, BS CTAB CARDIAC: RRR, no murmur,no extra heart sounds, +1 bilateral lower extremity is edema GI:  ABDOMEN: abdomen soft, normal BS, no masses, no tenderness  LIVER/SPLEEN: no hepatomegaly, no splenomegaly MUSCULOSKELETAL: HEAD: normal to inspection  EXTREMITIES: LEFT UPPER EXTREMITY: full range of motion, normal strength & tone RIGHT UPPER EXTREMITY:  full range of motion, normal strength & tone LEFT LOWER EXTREMITY:  full range of motion, normal strength & tone RIGHT LOWER EXTREMITY:  range of motion not tested due to surgery, normal strength & tone PSYCHIATRIC: the patient is alert & oriented to person, affect & behavior appropriate  LABS/RADIOLOGY:  Labs reviewed: Basic Metabolic Panel:  Recent Labs  16/09/9605/08/15 0920 05/25/14 0457 05/26/14 0420  NA 141  141 138  K 5.1 3.9 4.0  CL 102 105 101  CO2 28 24 25   GLUCOSE 105* 144* 123*  BUN 12 9 12   CREATININE 0.63 0.54 0.53  CALCIUM 9.5 8.7 9.1   Liver Function Tests:  Recent Labs  05/17/14 0920  AST 21  ALT 26  ALKPHOS 76  BILITOT 0.8  PROT 7.4  ALBUMIN 4.2   CBC:  Recent Labs   05/25/14 0457 05/26/14 0420 05/27/14 0524  WBC 10.6* 12.3* 9.1  HGB 11.6* 12.0 11.7*  HCT 34.5* 34.5* 34.2*  MCV 87.6 87.6 87.7  PLT 169 179 159    ASSESSMENT/PLAN:  Right knee osteoarthritis-status post total knee arthroplasty. Continue rehabilitation. Start OxyContin 10 mg every 12. Hypothyroidism-continue levothyroxine Hyperlipidemia-continue statin Constipation-MiraLax was started Acute blood loss anemia-check hemoglobin level Check CBC with differential and BMP  I have reviewed patient's medical records received at admission/from hospitalization.  CPT CODE: [id number]  Angela CoxGayani Y Dasanayaka, MD [facility] [phone]

## 2014-06-01 NOTE — Telephone Encounter (Signed)
[facility] 

## 2014-06-02 ENCOUNTER — Encounter: Payer: Self-pay | Admitting: Adult Health

## 2014-06-02 ENCOUNTER — Non-Acute Institutional Stay (SKILLED_NURSING_FACILITY): Payer: 59 | Admitting: Adult Health

## 2014-06-02 DIAGNOSIS — D62 Acute posthemorrhagic anemia: Secondary | ICD-10-CM | POA: Insufficient documentation

## 2014-06-02 DIAGNOSIS — E039 Hypothyroidism, unspecified: Secondary | ICD-10-CM

## 2014-06-02 NOTE — Progress Notes (Signed)
Patient ID: Lori Meyers, female   DOB: 06/03/1941, 73 y.o.   MRN: [id number]             PROGRESS NOTE  DATE: 06/02/14  FAC[facility]  LEVEL OF CARE: SNF (31)  Acute Visit  CHIEF COMPLAINT:  Manage Hypothyroidism  HISTORY OF PRESENT ILLNESS: This is a 73 year old female who was noted to have tsh = 14.7630 - elevated. No complaints of fatigue but has constipation issues.    PAST MEDICAL HISTORY : Reviewed.  No changes/see problem list  CURRENT MEDICATIONS: Reviewed per MAR/s  REVIEW OF SYSTEMS:  GENERAL: no change in appetite, no fatigue, no weight changes, no fever, chills or weakness RESPIRATORY: no cough, SOB, DOE, wheezing, hemoptysis CARDIAC: no chest pain, edema or palpitations GI: no abdominal pain, diarrhea, heart burn, nausea or vomiting, +constipation  PHYSICAL EXAMINATION  GENERAL: no acute distress, normal body habitus NECK: supple, trachea midline, no neck masses, no thyroid tenderness, no thyromegaly LYMPHATICS: no LAN in the neck, no supraclavicular LAN RESPIRATORY: breathing is even & unlabored, BS CTAB CARDIAC: RRR, no murmur,no extra heart sounds, no edema GI: abdomen soft, normal BS, no masses, no tenderness, no hepatomegaly, no splenomegaly EXTREMITIES: able to move all 4 extremities; limited ROM on RLE due to surgery; has RLE immobilizer PSYCHIATRIC: the patient is alert & oriented to person, affect & behavior appropriate  LABS/RADIOLOGY: 05/31/14  tsh 14.7630 Labs reviewed: Basic Metabolic Panel:  Recent Labs  40/98/1105/07/24 0920 05/25/14 0457 05/26/14 0420  NA 141 141 138  K 5.1 3.9 4.0  CL 102 105 101  CO2 28 24 25   GLUCOSE 105* 144* 123*  BUN 12 9 12   CREATININE 0.63 0.54 0.53  CALCIUM 9.5 8.7 9.1   Liver Function Tests:  Recent Labs  05/17/14 0920  AST 21  ALT 26  ALKPHOS 76  BILITOT 0.8  PROT 7.4  ALBUMIN 4.2   CBC:  Recent Labs  05/25/14 0457 05/26/14 0420 05/27/14 0524    WBC 10.6* 12.3* 9.1  HGB 11.6* 12.0 11.7*  HCT 34.5* 34.5* 34.2*  MCV 87.6 87.6 87.7  PLT 169 179 159    ASSESSMENT/PLAN:  Hypothyroidism -  Increase Synthroid to 100 mcg 1 tab PO Q D; check tsh in 6 weeks   CPT CODE: [id number]  Ella BodoMonina Vargas - NP [facility] [phone]

## 2014-06-02 NOTE — Progress Notes (Signed)
Patient ID: Lori Meyers, female   DOB: 06/15/1941, [age] y.o.   MRN: [id number]               PROGRESS NOTE  DATE: 05/28/2014  FACILITY: Nursing Home [facility]  LEVEL OF CARE: SNF (31)  Acute Visit  CHIEF COMPLAINT:  Follow-up Hospitalization  HISTORY OF PRESENT ILLNESS: This is a 73 year old female who has [facility] with Osteoarthritis S/P Right total knee arthroplasty. She has been admitted for a short-term rehabilitation.  REASSESSMENT OF ONGOING PROBLEM(S):  ANEMIA: The anemia has been stable. The patient denies fatigue, melena or hematochezia. No complications from the medications currently being used. 6/15 hgb 11.7  HYPERLIPIDEMIA: No complications from the medications presently being used.   HYPOTHYROIDISM: The hypothyroidism remains stable. No complications noted from the medications presently being used.  The patient complains of constipation.   PAST MEDICAL HISTORY : Reviewed.  No changes/see problem list  CURRENT MEDICATIONS: Reviewed per MAR/s  REVIEW OF SYSTEMS:  GENERAL: no change in appetite, no fatigue, no weight changes, no fever, chills or weakness RESPIRATORY: no cough, SOB, DOE, wheezing, hemoptysis CARDIAC: no chest pain, edema or palpitations GI: no abdominal pain, diarrhea, heart burn, nausea or vomiting, +constipation  PHYSICAL EXAMINATION  GENERAL: no acute distress, normal body habitus EYES: conjunctivae normal, sclerae normal, normal eye lids NECK: supple, trachea midline, no neck masses, no thyroid tenderness, no thyromegaly LYMPHATICS: no LAN in the neck, no supraclavicular LAN RESPIRATORY: breathing is even & unlabored, BS CTAB CARDIAC: RRR, no murmur,no extra heart sounds, no edema GI: abdomen soft, normal BS, no masses, no tenderness, no hepatomegaly, no splenomegaly EXTREMITIES: able to move all 4 extremities; limited ROM on RLE due to surgery; has RLE  immobilizer PSYCHIATRIC: the patient is alert & oriented to person, affect & behavior appropriate  LABS/RADIOLOGY: Labs reviewed: Basic Metabolic Panel:  Recent Labs  95/62/1305/07/24 0920 05/25/14 0457 05/26/14 0420  NA 141 141 138  K 5.1 3.9 4.0  CL 102 105 101  CO2 28 24 25   GLUCOSE 105* 144* 123*  BUN 12 9 12   CREATININE 0.63 0.54 0.53  CALCIUM 9.5 8.7 9.1   Liver Function Tests:  Recent Labs  05/17/14 0920  AST 21  ALT 26  ALKPHOS 76  BILITOT 0.8  PROT 7.4  ALBUMIN 4.2   CBC:  Recent Labs  05/25/14 0457 05/26/14 0420 05/27/14 0524  WBC 10.6* 12.3* 9.1  HGB 11.6* 12.0 11.7*  HCT 34.5* 34.5* 34.2*  MCV 87.6 87.6 87.7  PLT 169 179 159    ASSESSMENT/PLAN:  Osteoarthritis status post right total knee arthroplasty - for rehabilitation  Hyperlipidemia - continue Lipitor and Zetia Hypothyroidism - continue Synthroid and Cytomel; check tsh Anemia - stable Constipation - change Miralax 17gm + 6-8 oz liquid PO Q D    CPT CODE: [id number]  Ella BodoMonina Vargas - NP [facility] [phone]

## 2014-06-02 NOTE — Progress Notes (Signed)
This encounter was created in error - please disregard.

## 2014-06-02 NOTE — Addendum Note (Signed)
Addended by: Kenard GowerMEDINA-VARGAS, MONINA C on: 06/02/2014 02:57 PM   Modules accepted: Level of Service, SmartSet

## 2014-06-09 ENCOUNTER — Other Ambulatory Visit: Payer: Self-pay | Admitting: *Deleted

## 2014-06-09 MED ORDER — OXYCODONE HCL 5 MG PO TABS: ORAL_TABLET | ORAL | Status: DC

## 2014-06-10 ENCOUNTER — Non-Acute Institutional Stay (SKILLED_NURSING_FACILITY): Payer: 59 | Admitting: Adult Health

## 2014-06-10 ENCOUNTER — Encounter: Payer: Self-pay | Admitting: Adult Health

## 2014-06-10 DIAGNOSIS — D62 Acute posthemorrhagic anemia: Secondary | ICD-10-CM

## 2014-06-10 DIAGNOSIS — E039 Hypothyroidism, unspecified: Secondary | ICD-10-CM

## 2014-06-10 DIAGNOSIS — M1711 Unilateral primary osteoarthritis, right knee: Secondary | ICD-10-CM

## 2014-06-10 DIAGNOSIS — M1710 Unilateral primary osteoarthritis, unspecified knee: Secondary | ICD-10-CM

## 2014-06-10 DIAGNOSIS — E7800 Pure hypercholesterolemia, unspecified: Secondary | ICD-10-CM

## 2014-06-10 DIAGNOSIS — K5900 Constipation, unspecified: Secondary | ICD-10-CM

## 2014-06-10 NOTE — Progress Notes (Signed)
Patient ID: Lori Meyers, female   DOB: 03/03/1941, 73 y.o.   MRN: [id number]              PROGRESS NOTE  DATE:  06/10/14  FACILITY: Nursing Home Location: [facility]  LEVEL OF CARE: SNF (31)  Acute Visit  CHIEF COMPLAINT:  Discharge Notes  HISTORY OF PRESENT ILLNESS: This is a 73 year old female who is for discharge to outpatient rehabilitation. She has been [facility] with Osteoarthritis S/P Right total knee arthroplasty. Patient was admitted to this facility for short-term rehabilitation after the patient's recent hospitalization.  Patient has completed SNF rehabilitation and therapy has cleared the patient for discharge.   REASSESSMENT OF ONGOING PROBLEM(S):  ANEMIA: The anemia has been stable. The patient denies fatigue, melena or hematochezia. No complications from the medications currently being used. 6/15 hgb 12.5 HYPERLIPIDEMIA: No complications from the medications presently being used.   HYPOTHYROIDISM: The hypothyroidism remains stable. No complications noted from the medications presently being used.  The patient complains of constipation. 6/15 tsh 14.7630 recently increased dosage of Synthroid  CONSTIPATION: The constipation remains stable. No complications from the medications presently being used. Patient denies ongoing constipation, abdominal pain, nausea or vomiting. PAST MEDICAL HISTORY : Reviewed.  No changes/see problem list  CURRENT MEDICATIONS: Reviewed per MAR/s  REVIEW OF SYSTEMS:  GENERAL: no change in appetite, no fatigue, no weight changes, no fever, chills or weakness RESPIRATORY: no cough, SOB, DOE, wheezing, hemoptysis CARDIAC: no chest pain, edema or palpitations GI: no abdominal pain, diarrhea, heart burn, nausea or vomiting PHYSICAL EXAMINATION  GENERAL: no acute distress, normal body habitus NECK: supple, trachea midline, no neck masses, no thyroid tenderness, no  thyromegaly LYMPHATICS: no LAN in the neck, no supraclavicular LAN RESPIRATORY: breathing is even & unlabored, BS CTAB CARDIAC: RRR, no murmur,no extra heart sounds, no edema GI: abdomen soft, normal BS, no masses, no tenderness, no hepatomegaly, no splenomegaly EXTREMITIES: able to move all 4 extremities PSYCHIATRIC: the patient is alert & oriented to person, affect & behavior appropriate  LABS/RADIOLOGY: 06/03/14  sodium 135 potassium 4.2 glucose 135 BUN 8 creatinine 0.6 calcium 9.1 WBC 8.8 hemoglobin 12.5 hematocrit 37.5 05/31/14  TSH 14.7630 Labs reviewed: Basic Metabolic Panel:  Recent Labs  40/98/1105/07/24 0920 05/25/14 0457 05/26/14 0420  NA 141 141 138  K 5.1 3.9 4.0  CL 102 105 101  CO2 28 24 25   GLUCOSE 105* 144* 123*  BUN 12 9 12   CREATININE 0.63 0.54 0.53  CALCIUM 9.5 8.7 9.1   Liver Function Tests:  Recent Labs  05/17/14 0920  AST 21  ALT 26  ALKPHOS 76  BILITOT 0.8  PROT 7.4  ALBUMIN 4.2   CBC:  Recent Labs  05/25/14 0457 05/26/14 0420 05/27/14 0524  WBC 10.6* 12.3* 9.1  HGB 11.6* 12.0 11.7*  HCT 34.5* 34.5* 34.2*  MCV 87.6 87.6 87.7  PLT 169 179 159    ASSESSMENT/PLAN:  Osteoarthritis status post right total knee arthroplasty - for outpatient rehabilitation  Hyperlipidemia - continue Lipitor and Zetia Hypothyroidism - continue Synthroid and Cytomel Anemia - Resolved Constipation - stable; continue Miralax  I have filled out patient's discharge paperwork and written prescriptions.  Patient will have outpatient rehabilitation.  Total discharge time: Less than 30 minutes  Discharge time involved coordination of the discharge process with Child psychotherapistsocial worker, nursing staff and therapy department.    CPT CODE: [id number]   Ella BodoMonina Vargas - NP [facility] [phone]

## 2014-06-16 ENCOUNTER — Ambulatory Visit: Payer: Medicare Other | Attending: Orthopedic Surgery | Admitting: Physical Therapy

## 2014-06-16 DIAGNOSIS — M25669 Stiffness of unspecified knee, not elsewhere classified: Secondary | ICD-10-CM | POA: Diagnosis not present

## 2014-06-16 DIAGNOSIS — M25569 Pain in unspecified knee: Secondary | ICD-10-CM | POA: Insufficient documentation

## 2014-06-16 DIAGNOSIS — R262 Difficulty in walking, not elsewhere classified: Secondary | ICD-10-CM | POA: Diagnosis not present

## 2014-06-16 DIAGNOSIS — R609 Edema, unspecified: Secondary | ICD-10-CM | POA: Insufficient documentation

## 2014-06-18 ENCOUNTER — Ambulatory Visit: Payer: Medicare Other | Admitting: Physical Therapy

## 2014-06-18 DIAGNOSIS — M25569 Pain in unspecified knee: Secondary | ICD-10-CM | POA: Diagnosis not present

## 2014-06-21 ENCOUNTER — Ambulatory Visit: Payer: Medicare Other | Admitting: Physical Therapy

## 2014-06-21 DIAGNOSIS — M25569 Pain in unspecified knee: Secondary | ICD-10-CM | POA: Diagnosis not present

## 2014-06-23 ENCOUNTER — Ambulatory Visit: Payer: Medicare Other | Admitting: Physical Therapy

## 2014-06-23 DIAGNOSIS — M25569 Pain in unspecified knee: Secondary | ICD-10-CM | POA: Diagnosis not present

## 2014-06-25 ENCOUNTER — Ambulatory Visit: Payer: Medicare Other | Admitting: Physical Therapy

## 2014-06-28 ENCOUNTER — Other Ambulatory Visit: Payer: Self-pay | Admitting: Internal Medicine

## 2014-06-28 ENCOUNTER — Ambulatory Visit: Payer: Medicare Other | Admitting: Physical Therapy

## 2014-06-28 DIAGNOSIS — M5030 Other cervical disc degeneration, unspecified cervical region: Secondary | ICD-10-CM

## 2014-06-28 DIAGNOSIS — M25569 Pain in unspecified knee: Secondary | ICD-10-CM | POA: Diagnosis not present

## 2014-06-30 ENCOUNTER — Ambulatory Visit: Payer: Medicare Other | Admitting: Physical Therapy

## 2014-06-30 DIAGNOSIS — M25569 Pain in unspecified knee: Secondary | ICD-10-CM | POA: Diagnosis not present

## 2014-07-02 ENCOUNTER — Ambulatory Visit: Payer: Medicare Other | Admitting: Physical Therapy

## 2014-07-02 DIAGNOSIS — M25569 Pain in unspecified knee: Secondary | ICD-10-CM | POA: Diagnosis not present

## 2014-07-03 ENCOUNTER — Other Ambulatory Visit: Payer: Medicare Other

## 2014-07-05 ENCOUNTER — Ambulatory Visit: Payer: Medicare Other | Admitting: Physical Therapy

## 2014-07-05 DIAGNOSIS — M25569 Pain in unspecified knee: Secondary | ICD-10-CM | POA: Diagnosis not present

## 2014-07-06 ENCOUNTER — Ambulatory Visit
Admission: RE | Admit: 2014-07-06 | Discharge: 2014-07-06 | Disposition: A | Discharge status: Home / Self Care | Payer: 59 | Source: Ambulatory Visit | Attending: Internal Medicine | Admitting: Internal Medicine

## 2014-07-06 DIAGNOSIS — M5030 Other cervical disc degeneration, unspecified cervical region: Secondary | ICD-10-CM

## 2014-07-07 ENCOUNTER — Ambulatory Visit: Payer: Medicare Other | Admitting: Physical Therapy

## 2014-07-07 DIAGNOSIS — M25569 Pain in unspecified knee: Secondary | ICD-10-CM | POA: Diagnosis not present

## 2014-07-08 ENCOUNTER — Ambulatory Visit: Payer: Medicare Other | Admitting: Physical Therapy

## 2014-07-08 DIAGNOSIS — M25569 Pain in unspecified knee: Secondary | ICD-10-CM | POA: Diagnosis not present

## 2014-07-12 ENCOUNTER — Ambulatory Visit: Payer: Medicare Other | Attending: Orthopedic Surgery | Admitting: Physical Therapy

## 2014-07-12 DIAGNOSIS — R609 Edema, unspecified: Secondary | ICD-10-CM | POA: Diagnosis not present

## 2014-07-12 DIAGNOSIS — M25569 Pain in unspecified knee: Secondary | ICD-10-CM | POA: Insufficient documentation

## 2014-07-12 DIAGNOSIS — M25669 Stiffness of unspecified knee, not elsewhere classified: Secondary | ICD-10-CM | POA: Insufficient documentation

## 2014-07-12 DIAGNOSIS — R262 Difficulty in walking, not elsewhere classified: Secondary | ICD-10-CM | POA: Insufficient documentation

## 2014-07-14 ENCOUNTER — Ambulatory Visit: Payer: Medicare Other | Admitting: Physical Therapy

## 2014-07-14 DIAGNOSIS — M25569 Pain in unspecified knee: Secondary | ICD-10-CM | POA: Diagnosis not present

## 2014-07-16 ENCOUNTER — Ambulatory Visit: Payer: Medicare Other | Admitting: Physical Therapy

## 2014-07-16 DIAGNOSIS — M25569 Pain in unspecified knee: Secondary | ICD-10-CM | POA: Diagnosis not present

## 2014-07-19 ENCOUNTER — Ambulatory Visit: Payer: Medicare Other | Admitting: Physical Therapy

## 2014-07-19 DIAGNOSIS — M25569 Pain in unspecified knee: Secondary | ICD-10-CM | POA: Diagnosis not present

## 2014-07-21 ENCOUNTER — Ambulatory Visit: Payer: Medicare Other | Admitting: Physical Therapy

## 2014-07-21 DIAGNOSIS — M25569 Pain in unspecified knee: Secondary | ICD-10-CM | POA: Diagnosis not present

## 2014-07-23 ENCOUNTER — Ambulatory Visit: Payer: Medicare Other | Admitting: Physical Therapy

## 2014-07-23 DIAGNOSIS — M25569 Pain in unspecified knee: Secondary | ICD-10-CM | POA: Diagnosis not present

## 2014-12-28 ENCOUNTER — Ambulatory Visit: Payer: Medicare Other | Attending: Orthopedic Surgery | Admitting: Physical Therapy

## 2014-12-28 DIAGNOSIS — M5032 Other cervical disc degeneration, mid-cervical region: Secondary | ICD-10-CM | POA: Diagnosis not present

## 2014-12-28 DIAGNOSIS — M542 Cervicalgia: Secondary | ICD-10-CM | POA: Insufficient documentation

## 2015-02-22 NOTE — Progress Notes (Signed)
Need orders  In epic, pre op is 03-07-15

## 2015-02-24 ENCOUNTER — Ambulatory Visit: Payer: Self-pay | Admitting: Orthopedic Surgery

## 2015-02-24 NOTE — Progress Notes (Signed)
Preoperative surgical orders have been place into the Epic hospital system for Lori Meyers on 02/24/2015, 2:00 PM  by Patrica DuelPERKINS, ALEXZANDREW for surgery on 03-14-2015.  Preop Total Knee orders including Experal, IV Tylenol, and IV Decadron as long as there are no contraindications to the above medications. Avel Peacerew Perkins, PA-C

## 2015-03-07 ENCOUNTER — Encounter (HOSPITAL_COMMUNITY): Payer: Self-pay

## 2015-03-07 ENCOUNTER — Encounter (HOSPITAL_COMMUNITY)
Admission: RE | Admit: 2015-03-07 | Discharge: 2015-03-07 | Disposition: A | Discharge status: Home / Self Care | Payer: Medicare Other | Source: Ambulatory Visit | Attending: Orthopedic Surgery | Admitting: Orthopedic Surgery

## 2015-03-07 ENCOUNTER — Encounter (HOSPITAL_COMMUNITY): Admission: RE | Admit: 2015-03-07 | Payer: Medicare Other | Source: Ambulatory Visit

## 2015-03-07 DIAGNOSIS — Z01812 Encounter for preprocedural laboratory examination: Secondary | ICD-10-CM | POA: Diagnosis present

## 2015-03-07 LAB — COMPREHENSIVE METABOLIC PANEL
ALT: 25 U/L (ref 0–35)
AST: 39 U/L — AB (ref 0–37)
Albumin: 4.4 g/dL (ref 3.5–5.2)
Alkaline Phosphatase: 74 U/L (ref 39–117)
Anion gap: 8 (ref 5–15)
BUN: 21 mg/dL (ref 6–23)
CO2: 29 mmol/L (ref 19–32)
Calcium: 9.4 mg/dL (ref 8.4–10.5)
Chloride: 104 mmol/L (ref 96–112)
Creatinine, Ser: 0.77 mg/dL (ref 0.50–1.10)
GFR, EST NON AFRICAN AMERICAN: 81 mL/min — AB (ref >90)
Glucose, Bld: 106 mg/dL — ABNORMAL HIGH (ref 70–99)
POTASSIUM: 4.8 mmol/L (ref 3.5–5.1)
Sodium: 141 mmol/L (ref 135–145)
Total Bilirubin: 1 mg/dL (ref 0.3–1.2)
Total Protein: 7.2 g/dL (ref 6.0–8.3)

## 2015-03-07 LAB — CBC
HCT: 44 % (ref 36.0–46.0)
HEMOGLOBIN: 15 g/dL (ref 12.0–15.0)
MCH: 30.4 pg (ref 26.0–34.0)
MCHC: 34.1 g/dL (ref 30.0–36.0)
MCV: 89.1 fL (ref 78.0–100.0)
Platelets: 183 10*3/uL (ref 150–400)
RBC: 4.94 MIL/uL (ref 3.87–5.11)
RDW: 13 % (ref 11.5–15.5)
WBC: 8.2 10*3/uL (ref 4.0–10.5)

## 2015-03-07 LAB — URINALYSIS, ROUTINE W REFLEX MICROSCOPIC
BILIRUBIN URINE: NEGATIVE
Glucose, UA: NEGATIVE mg/dL
Hgb urine dipstick: NEGATIVE
Ketones, ur: NEGATIVE mg/dL
LEUKOCYTES UA: NEGATIVE
Nitrite: NEGATIVE
PH: 6.5 (ref 5.0–8.0)
Protein, ur: NEGATIVE mg/dL
SPECIFIC GRAVITY, URINE: 1.003 — AB (ref 1.005–1.030)
UROBILINOGEN UA: 0.2 mg/dL (ref 0.0–1.0)

## 2015-03-07 LAB — APTT: aPTT: 29 seconds (ref 24–37)

## 2015-03-07 LAB — PROTIME-INR
INR: 0.93 (ref 0.00–1.49)
PROTHROMBIN TIME: 12.5 s (ref 11.6–15.2)

## 2015-03-07 LAB — SURGICAL PCR SCREEN
MRSA, PCR: NEGATIVE
Staphylococcus aureus: NEGATIVE

## 2015-03-07 NOTE — Progress Notes (Signed)
ekg 6/15 epic Clearance dr Waynard Edwardsperini  chart

## 2015-03-07 NOTE — Patient Instructions (Addendum)
Your procedure is scheduled on:  03/14/15  MONDAY  Report to [facility]-- MAIN ENTRANCE- FOLLOW SIGNS TO SHORT STAY CENTER Short Stay Center at     0600  AM.   Call this number if you have problems the morning of surgery: [phone]        Do not eat food  Or drink :After Midnight. Sunday NIGHT   Take these medicines the morning of surgery with A SIP OF WATER: LEVOTHYROXINE, CYTOMEL   .  Contacts, dentures or partial plates, or metal hairpins  can not be worn to surgery. Your family will be responsible for glasses, dentures, hearing aides while you are in surgery  Leave suitcase in the car. After surgery it may be brought to your room.  For patients admitted to the hospital, checkout time is 11:00 AM day of  discharge.         [place] IS NOT RESPONSIBLE FOR ANY VALUABLES  Patients discharged the day of surgery will not be allowed to drive home. IF going home the day of surgery, you must have a driver and someone to stay with you for the first 24 hours                                                                                                                               - Preparing for Surgery Before surgery, you can play an important role.  Because skin is not sterile, your skin needs to be as free of germs as possible.  You can reduce the number of germs on your skin by washing with CHG (chlorahexidine gluconate) soap before surgery.  CHG is an antiseptic cleaner which kills germs and bonds with the skin to continue killing germs even after washing. Please DO NOT use if you have an allergy to CHG or antibacterial soaps.  If your skin becomes reddened/irritated stop using the CHG and inform your nurse when you arrive at Short Stay. Do not shave (including legs and underarms) for at least 48 hours prior to the first CHG shower.  You may shave your face/neck. Please follow these instructions carefully:  1.  Shower with CHG Soap the night before surgery and  the  morning of Surgery.  2.  If you choose to wash your hair, wash your hair first as usual with your  normal  shampoo.  3.  After you shampoo, rinse your hair and body thoroughly to remove the  shampoo.                           4.  Use CHG as you would any other liquid soap.  You can apply chg directly  to the skin and wash                       Gently with a scrungie or clean washcloth.  5.  Apply the CHG Soap to your body ONLY FROM THE NECK DOWN.   Do not use on face/ open                           Wound or open sores. Avoid contact with eyes, ears mouth and genitals (private parts).                       Wash face,  Genitals (private parts) with your normal soap.             6.  Wash thoroughly, paying special attention to the area where your surgery  will be performed.  7.  Thoroughly rinse your body with warm water from the neck down.  8.  DO NOT shower/wash with your normal soap after using and rinsing off  the CHG Soap.                9.  Pat yourself dry with a clean towel.            10.  Wear clean pajamas.            11.  Place clean sheets on your bed the night of your first shower and do not  sleep with pets. Day of Surgery : Do not apply any lotions/deodorants the morning of surgery.  Please wear clean clothes to the hospital/surgery center.  FAILURE TO FOLLOW THESE INSTRUCTIONS MAY RESULT IN THE CANCELLATION OF YOUR SURGERY PATIENT SIGNATURE_________________________________  NURSE SIGNATURE__________________________________  ________________________________________________________________________   Lori Meyers  An incentive spirometer is a tool that can help keep your lungs clear and active. This tool measures how well you are filling your lungs with each breath. Taking long deep breaths may help reverse or decrease the chance of developing breathing (pulmonary) problems (especially infection) following:  A long period of time when you are unable to move or be  active. BEFORE THE PROCEDURE   If the spirometer includes an indicator to show your best effort, your nurse or respiratory therapist will set it to a desired goal.  If possible, sit up straight or lean slightly forward. Try not to slouch.  Hold the incentive spirometer in an upright position. INSTRUCTIONS FOR USE   Sit on the edge of your bed if possible, or sit up as far as you can in bed or on a chair.  Hold the incentive spirometer in an upright position.  Breathe out normally.  Place the mouthpiece in your mouth and seal your lips tightly around it.  Breathe in slowly and as deeply as possible, raising the piston or the ball toward the top of the column.  Hold your breath for 3-5 seconds or for as long as possible. Allow the piston or ball to fall to the bottom of the column.  Remove the mouthpiece from your mouth and breathe out normally.  Rest for a few seconds and repeat Steps 1 through 7 at least 10 times every 1-2 hours when you are awake. Take your time and take a few normal breaths between deep breaths.  The spirometer may include an indicator to show your best effort. Use the indicator as a goal to work toward during each repetition.  After each set of 10 deep breaths, practice coughing to be sure your lungs are clear. If you have an incision (the cut made at the time of surgery), support your incision when coughing by placing a  pillow or rolled up towels firmly against it. Once you are able to get out of bed, walk around indoors and cough well. You may stop using the incentive spirometer when instructed by your caregiver.  RISKS AND COMPLICATIONS  Take your time so you do not get dizzy or light-headed.  If you are in pain, you may need to take or ask for pain medication before doing incentive spirometry. It is harder to take a deep breath if you are having pain. AFTER USE  Rest and breathe slowly and easily.  It can be helpful to keep track of a log of your  progress. Your caregiver can provide you with a simple table to help with this. If you are using the spirometer at home, follow these instructions: SEEK MEDICAL CARE IF:   You are having difficultly using the spirometer.  You have trouble using the spirometer as often as instructed.  Your pain medication is not giving enough relief while using the spirometer.  You develop fever of 100.5 F (38.1 C) or higher. SEEK IMMEDIATE MEDICAL CARE IF:   You cough up bloody sputum that had not been present before.  You develop fever of 102 F (38.9 C) or greater.  You develop worsening pain at or near the incision site. MAKE SURE YOU:   Understand these instructions.  Will watch your condition.  Will get help right away if you are not doing well or get worse. Document Released: 04/08/2007 Document Revised: 02/18/2012 Document Reviewed: 06/09/2007 ExitCare Patient Information 2014 ExitCare, [place].   ________________________________________________________________________  WHAT IS A BLOOD TRANSFUSION? Blood Transfusion Information  A transfusion is the replacement of blood or some of its parts. Blood is made up of multiple cells which provide different functions.  Red blood cells carry oxygen and are used for blood loss replacement.  White blood cells fight against infection.  Platelets control bleeding.  Plasma helps clot blood.  Other blood products are available for specialized needs, such as hemophilia or other clotting disorders. BEFORE THE TRANSFUSION  Who gives blood for transfusions?   Healthy volunteers who are fully evaluated to make sure their blood is safe. This is blood bank blood. Transfusion therapy is the safest it has ever been in the practice of medicine. Before blood is taken from a donor, a complete history is taken to make sure that person has no history of diseases nor engages in risky social behavior (examples are intravenous drug use or sexual activity with  multiple partners). The donor's travel history is screened to minimize risk of transmitting infections, such as malaria. The donated blood is tested for signs of infectious diseases, such as HIV and hepatitis. The blood is then tested to be sure it is compatible with you in order to minimize the chance of a transfusion reaction. If you or a relative donates blood, this is often done in anticipation of surgery and is not appropriate for emergency situations. It takes many days to process the donated blood. RISKS AND COMPLICATIONS Although transfusion therapy is very safe and saves many lives, the main dangers of transfusion include:   Getting an infectious disease.  Developing a transfusion reaction. This is an allergic reaction to something in the blood you were given. Every precaution is taken to prevent this. The decision to have a blood transfusion has been considered carefully by your caregiver before blood is given. Blood is not given unless the benefits outweigh the risks. AFTER THE TRANSFUSION  Right after receiving a blood transfusion, you  will usually feel much better and more energetic. This is especially true if your red blood cells have gotten low (anemic). The transfusion raises the level of the red blood cells which carry oxygen, and this usually causes an energy increase.  The nurse administering the transfusion will monitor you carefully for complications. HOME CARE INSTRUCTIONS  No special instructions are needed after a transfusion. You may find your energy is better. Speak with your caregiver about any limitations on activity for underlying diseases you may have. SEEK MEDICAL CARE IF:   Your condition is not improving after your transfusion.  You develop redness or irritation at the intravenous (IV) site. SEEK IMMEDIATE MEDICAL CARE IF:  Any of the following symptoms occur over the next 12 hours:  Shaking chills.  You have a temperature by mouth above 102 F (38.9 C), not  controlled by medicine.  Chest, back, or muscle pain.  People around you feel you are not acting correctly or are confused.  Shortness of breath or difficulty breathing.  Dizziness and fainting.  You get a rash or develop hives.  You have a decrease in urine output.  Your urine turns a dark color or changes to pink, red, or brown. Any of the following symptoms occur over the next 10 days:  You have a temperature by mouth above 102 F (38.9 C), not controlled by medicine.  Shortness of breath.  Weakness after normal activity.  The white part of the eye turns yellow (jaundice).  You have a decrease in the amount of urine or are urinating less often.  Your urine turns a dark color or changes to pink, red, or brown. Document Released: 11/23/2000 Document Revised: 02/18/2012 Document Reviewed: 07/12/2008 [facility] Patient Information 2014 [place], [place].  _______________________________________________________________________

## 2015-03-13 ENCOUNTER — Ambulatory Visit: Payer: Self-pay | Admitting: Orthopedic Surgery

## 2015-03-13 NOTE — Anesthesia Preprocedure Evaluation (Addendum)
Anesthesia Evaluation  Patient identified by MRN, date of birth, ID band Patient awake    Reviewed: Allergy & Precautions, H&P , NPO status , Patient's Chart, lab work & pertinent test results  Airway Mallampati: II  TM Distance: >3 FB Neck ROM: Full    Dental  (+) Dental Advisory Given, Teeth Intact   Pulmonary neg pulmonary ROS,  breath sounds clear to auscultation  Pulmonary exam normal       Cardiovascular negative cardio ROS  Rhythm:Regular Rate:Normal     Neuro/Psych negative neurological ROS  negative psych ROS   GI/Hepatic negative GI ROS, Neg liver ROS,   Endo/Other  negative endocrine ROSHypothyroidism   Renal/GU negative Renal ROS     Musculoskeletal negative musculoskeletal ROS (+)   Abdominal Normal abdominal exam  (+)   Peds  Hematology negative hematology ROS (+)   Anesthesia Other Findings   Reproductive/Obstetrics negative OB ROS                             Anesthesia Physical Anesthesia Plan  ASA: II  Anesthesia Plan: Spinal   Post-op Pain Management:    Induction:   Airway Management Planned:   Additional Equipment:   Intra-op Plan:   Post-operative Plan:   Informed Consent:   Plan Discussed with: Surgeon  Anesthesia Plan Comments:         Anesthesia Quick Evaluation

## 2015-03-13 NOTE — H&P (Signed)
Lori Meyers DOB: [birth year] Married / Language: English / Race: White Female Date of Admission:  03/14/2015 CC:  Left Knee Pain History of Present Illness The patient is a 74 year old female who comes in for a preoperative History and Physical. The patient is scheduled for a left total knee arthroplasty to be performed by Dr. Gus Rankin. Aluisio, MD at [facility] on 03-14-2015. The patient is a 74 year old who comes in several months out from right total knee arthroplasty. The patient states that she is doing well (some sorness) at this time. The pain is under excellent control at this time and describe their pain as mild (at times ). They are currently on no medication for their pain. The patient is currently doing home exercise program (she has d/c and now is doing water aerobics). Patient is still complaining of left knee pain that has progressed with pain. it has not gotten as bad as the right was prior to that surgery but it has been progressively getting worse with time. The left knee is problematic for her. She is having a lot of medial sided pain. This occasionally feels like it wants to give out. The pain is not as intense as the right knee was prior to the surgery but is still problematic for her. She is ready now to get the other knee done at this time. She elects to proceed with the left total knee replacement. They have been treated conservatively in the past for the above stated problem and despite conservative measures, they continue to have progressive pain and severe functional limitations and dysfunction. They have failed non-operative management including home exercise, medications. It is felt that they would benefit from undergoing total joint replacement. Risks and benefits of the procedure have been discussed with the patient and they elect to proceed with surgery. There are no active contraindications to surgery such as ongoing infection or rapidly progressive neurological  disease.  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 First Degree Relatives reported Heart Disease Maternal Grandmother, Mother, Paternal Grandmother. Heart disease in female family member before age 72 Congestive Heart Failure Maternal Grandmother, Mother, Paternal Emelia Loron, Paternal Grandmother. Cerebrovascular Accident Maternal Grandfather. Father Deceased. age 90, "old age" Mother Heart Attack  Social History  Tobacco / smoke exposure 01/23/2014: no Never consumed alcohol 01/23/2014: Never consumed alcohol Current work status retired Tobacco use Never smoker. 01/23/2014 Not under pain contract No history of drug/alcohol rehab Number of flights of stairs before winded 2-3 Children 2 Exercise Exercises weekly; does other Marital status married Living situation live with spouse Advance Directives Healthcare POA Post-Surgical Plans Wants to go to [facility] again.  Medication History  Valium (  Tablet, 1/2-1 Tablet Oral every six hours, as needed, Taken starting 12/17/2014) Active. Methocarbamol (  Tablet, Oral) Active. OxyCODONE HCl (  Tablet, Oral) Active. CeleBREX (  Capsule, Oral) Active. Glucosamine Chondr 500 Complex (Oral) Active. Multivitamin (Oral) Active. Cytomel ( Tablet, Oral) Active. Lipitor (  Tablet, Oral) Active. Synthroid ( Tablet, Oral) Active. Zetia (  Tablet, Oral) Active.  Past Surgical History Tonsillectomy Thyroidectomy; Total Appendectomy Date: 01/1951. Arthroscopy of Knee Date: 04/2011. left Hysterectomy Date: 75. complete (non-cancerous) Right Middle Finger Joint Replacement Date: 2005. Total Knee Replacement -  Right Date: 05/2014.   Review of Systems General Not Present- Chills, Fatigue, Fever, Memory Loss, Night Sweats,  Weight Gain and Weight Loss. Skin Not Present- Eczema, Hives, Itching, Lesions and Rash. HEENT Present- Hearing Loss. Not Present- Dentures, Double Vision, Headache, Tinnitus and Visual Loss. Respiratory Not Present- Allergies, Chronic Cough, Coughing up blood, Shortness of breath at rest and Shortness of breath with exertion. Cardiovascular Not Present- Chest Pain, Difficulty Breathing Lying Down, Murmur, Palpitations, Racing/skipping heartbeats and Swelling. Gastrointestinal Not Present- Abdominal Pain, Bloody Stool, Constipation, Diarrhea, Difficulty Swallowing, Heartburn, Jaundice, Loss of appetitie, Nausea and Vomiting. Musculoskeletal Present- Joint Pain and Joint Swelling. Not Present- Back Pain, Morning Stiffness, Muscle Pain, Muscle Weakness and Spasms. Neurological Not Present- Blackout spells, Difficulty with balance, Dizziness, Paralysis, Tremor and Weakness. Psychiatric Not Present- Insomnia.   Vitals  Weight: 170 lb Height: 63.5in Weight was reported by patient. Height was reported by patient. Body Surface Area: 1.82 m Body Mass Index: 29.64 kg/m  BP: 146/82 (Sitting, Right Arm, Standard)  Physical Exam (Alexzandrew L. Perkins III PA-C; 03/13/2015 2:43 PM) General Mental Status -Alert, cooperative and good historian. General Appearance-pleasant, Not in acute distress. Orientation-Oriented X3. Build & Nutrition-Well nourished and Well developed.  Head and Neck Head-normocephalic, atraumatic . Neck Global Assessment - supple, no bruit auscultated on the right, no bruit auscultated on the left.  Eye Pupil - Bilateral-Regular and Round. Motion - Bilateral-EOMI.  Chest and Lung Exam Auscultation Breath sounds - clear at anterior chest wall and clear at posterior chest wall. Adventitious sounds - No Adventitious  sounds.  Cardiovascular Auscultation Rhythm - Regular rate and rhythm. Heart Sounds - S1 WNL and S2 WNL. Murmurs & Other Heart Sounds - Auscultation of the heart reveals - No Murmurs.  Abdomen Palpation/Percussion Tenderness - Abdomen is non-tender to palpation. Rigidity (guarding) - Abdomen is soft. Auscultation Auscultation of the abdomen reveals - Bowel sounds normal.  Female Genitourinary Note: Not done, not pertinent to present illness   Musculoskeletal Note: She is alert and oriented. No apparent distress. The right knee looks great. The range of motion on the right is about 5-115. There is no tenderness or instability on the right knee. The left knee shows a varus deformity. Range is 5-125. There is marked crepitus on range of motion, tenderness, medial greater than lateral with no instability noted.  RADIOGRAPHS: X-rays from her right knee taken in July and she has bone on bone in the medial and patellofemoral compartments.   Assessment & Plan  Primary osteoarthritis of left knee (M17.12) Note:Surgical Plans: Left Total Knee Repalcement  Disposition: [facility]  PCP: Dr. Waynard EdwardsPerini - Patient has been seen preoperatively and felt to be stable for surgery.  IV TXA  Anesthesia Issues: None  Signed electronically by Lauraine RinneAlexzandrew L Perkins, III PA-C

## 2015-03-14 ENCOUNTER — Inpatient Hospital Stay (HOSPITAL_COMMUNITY): Payer: Medicare Other | Admitting: Anesthesiology

## 2015-03-14 ENCOUNTER — Inpatient Hospital Stay (HOSPITAL_COMMUNITY)
Admission: RE | Admit: 2015-03-14 | Discharge: 2015-03-17 | DRG: 470 | Disposition: A | Discharge status: Skilled Nursing Facility | Payer: Medicare Other | Source: Ambulatory Visit | Attending: Orthopedic Surgery | Admitting: Orthopedic Surgery

## 2015-03-14 ENCOUNTER — Encounter (HOSPITAL_COMMUNITY): Payer: Self-pay | Admitting: *Deleted

## 2015-03-14 ENCOUNTER — Encounter (HOSPITAL_COMMUNITY)
Admission: RE | Disposition: A | Discharge status: Skilled Nursing Facility | Payer: Self-pay | Source: Ambulatory Visit | Attending: Orthopedic Surgery

## 2015-03-14 DIAGNOSIS — E780 Pure hypercholesterolemia: Secondary | ICD-10-CM | POA: Diagnosis present

## 2015-03-14 DIAGNOSIS — M1712 Unilateral primary osteoarthritis, left knee: Principal | ICD-10-CM | POA: Diagnosis present

## 2015-03-14 DIAGNOSIS — Z96651 Presence of right artificial knee joint: Secondary | ICD-10-CM | POA: Diagnosis present

## 2015-03-14 DIAGNOSIS — H9190 Unspecified hearing loss, unspecified ear: Secondary | ICD-10-CM | POA: Diagnosis present

## 2015-03-14 DIAGNOSIS — M1710 Unilateral primary osteoarthritis, unspecified knee: Secondary | ICD-10-CM | POA: Diagnosis present

## 2015-03-14 DIAGNOSIS — Z90710 Acquired absence of both cervix and uterus: Secondary | ICD-10-CM | POA: Diagnosis not present

## 2015-03-14 DIAGNOSIS — M179 Osteoarthritis of knee, unspecified: Secondary | ICD-10-CM | POA: Diagnosis present

## 2015-03-14 DIAGNOSIS — M25562 Pain in left knee: Secondary | ICD-10-CM | POA: Diagnosis present

## 2015-03-14 DIAGNOSIS — Z888 Allergy status to other drugs, medicaments and biological substances status: Secondary | ICD-10-CM | POA: Diagnosis not present

## 2015-03-14 DIAGNOSIS — E039 Hypothyroidism, unspecified: Secondary | ICD-10-CM | POA: Diagnosis present

## 2015-03-14 DIAGNOSIS — Z9049 Acquired absence of other specified parts of digestive tract: Secondary | ICD-10-CM | POA: Diagnosis present

## 2015-03-14 HISTORY — PX: TOTAL KNEE ARTHROPLASTY: SHX125

## 2015-03-14 LAB — TYPE AND SCREEN
ABO/RH(D): O POS
Antibody Screen: NEGATIVE

## 2015-03-14 SURGERY — ARTHROPLASTY, KNEE, TOTAL
Anesthesia: Spinal | Site: Knee | Laterality: Left

## 2015-03-14 MED ORDER — MENTHOL 3 MG MT LOZG: 1.0000 | LOZENGE | OROMUCOSAL | Status: DC | PRN

## 2015-03-14 MED ORDER — PHENOL 1.4 % MT LIQD: 1.0000 | OROMUCOSAL | Status: DC | PRN

## 2015-03-14 MED ORDER — CEFAZOLIN SODIUM-DEXTROSE 2-3 GM-% IV SOLR
INTRAVENOUS | Status: AC
  Filled 2015-03-14: qty 50

## 2015-03-14 MED ORDER — DEXAMETHASONE SODIUM PHOSPHATE 10 MG/ML IJ SOLN
10.0000 mg | Freq: Once | INTRAMUSCULAR | Status: AC
  Administered 2015-03-14: 10 mg via INTRAVENOUS

## 2015-03-14 MED ORDER — BUPIVACAINE LIPOSOME 1.3 % IJ SUSP
INTRAMUSCULAR | Status: DC | PRN
  Administered 2015-03-14: 20 mL

## 2015-03-14 MED ORDER — LIOTHYRONINE SODIUM 5 MCG PO TABS
5.0000 ug | ORAL_TABLET | Freq: Two times a day (BID) | ORAL | Status: DC
  Administered 2015-03-14 – 2015-03-17 (×6): 5 ug via ORAL
  Filled 2015-03-14 (×10): qty 1

## 2015-03-14 MED ORDER — TRANEXAMIC ACID 100 MG/ML IV SOLN
1000.0000 mg | INTRAVENOUS | Status: AC
  Administered 2015-03-14: 1000 mg via INTRAVENOUS
  Filled 2015-03-14: qty 10

## 2015-03-14 MED ORDER — BUPIVACAINE HCL (PF) 0.25 % IJ SOLN
INTRAMUSCULAR | Status: AC
  Filled 2015-03-14: qty 30

## 2015-03-14 MED ORDER — PROPOFOL INFUSION 10 MG/ML OPTIME
INTRAVENOUS | Status: DC | PRN
  Administered 2015-03-14: 50 ug/kg/min via INTRAVENOUS

## 2015-03-14 MED ORDER — LEVOTHYROXINE SODIUM 100 MCG PO TABS
100.0000 ug | ORAL_TABLET | ORAL | Status: DC
  Administered 2015-03-16: 100 ug via ORAL
  Filled 2015-03-14 (×2): qty 1

## 2015-03-14 MED ORDER — DEXAMETHASONE SODIUM PHOSPHATE 10 MG/ML IJ SOLN
10.0000 mg | Freq: Once | INTRAMUSCULAR | Status: AC
  Administered 2015-03-15: 10 mg via INTRAVENOUS
  Filled 2015-03-14: qty 1

## 2015-03-14 MED ORDER — SODIUM CHLORIDE 0.9 % IV SOLN
INTRAVENOUS | Status: DC
  Administered 2015-03-14: 14:00:00 via INTRAVENOUS

## 2015-03-14 MED ORDER — PHENYLEPHRINE HCL 10 MG/ML IJ SOLN
10.0000 mg | INTRAVENOUS | Status: DC | PRN
  Administered 2015-03-14: 40 ug/min via INTRAVENOUS

## 2015-03-14 MED ORDER — FLEET ENEMA 7-19 GM/118ML RE ENEM: 1.0000 | ENEMA | Freq: Once | RECTAL | Status: AC | PRN

## 2015-03-14 MED ORDER — BUPIVACAINE HCL 0.25 % IJ SOLN
INTRAMUSCULAR | Status: DC | PRN
  Administered 2015-03-14: 20 mL

## 2015-03-14 MED ORDER — SODIUM CHLORIDE 0.9 % IJ SOLN
INTRAMUSCULAR | Status: DC | PRN
  Administered 2015-03-14: 30 mL

## 2015-03-14 MED ORDER — PROPOFOL 10 MG/ML IV BOLUS
INTRAVENOUS | Status: AC
  Filled 2015-03-14: qty 20

## 2015-03-14 MED ORDER — POLYETHYLENE GLYCOL 3350 17 G PO PACK: 17.0000 g | PACK | Freq: Every day | ORAL | Status: DC | PRN

## 2015-03-14 MED ORDER — ONDANSETRON HCL 4 MG/2ML IJ SOLN
4.0000 mg | Freq: Four times a day (QID) | INTRAMUSCULAR | Status: DC | PRN
  Administered 2015-03-15 – 2015-03-16 (×2): 4 mg via INTRAVENOUS
  Filled 2015-03-14 (×2): qty 2

## 2015-03-14 MED ORDER — LACTATED RINGERS IV SOLN: INTRAVENOUS | Status: DC

## 2015-03-14 MED ORDER — HYDROMORPHONE HCL 1 MG/ML IJ SOLN: 0.2500 mg | INTRAMUSCULAR | Status: DC | PRN

## 2015-03-14 MED ORDER — 0.9 % SODIUM CHLORIDE (POUR BTL) OPTIME
TOPICAL | Status: DC | PRN
  Administered 2015-03-14: 1000 mL

## 2015-03-14 MED ORDER — ACETAMINOPHEN 10 MG/ML IV SOLN
1000.0000 mg | Freq: Once | INTRAVENOUS | Status: AC
  Administered 2015-03-14: 1000 mg via INTRAVENOUS
  Filled 2015-03-14: qty 100

## 2015-03-14 MED ORDER — ACETAMINOPHEN 650 MG RE SUPP: 650.0000 mg | Freq: Four times a day (QID) | RECTAL | Status: DC | PRN

## 2015-03-14 MED ORDER — ACETAMINOPHEN 325 MG PO TABS
650.0000 mg | ORAL_TABLET | Freq: Four times a day (QID) | ORAL | Status: DC | PRN
  Administered 2015-03-16 – 2015-03-17 (×2): 650 mg via ORAL
  Filled 2015-03-14 (×2): qty 2

## 2015-03-14 MED ORDER — LACTATED RINGERS IV SOLN
INTRAVENOUS | Status: DC | PRN
  Administered 2015-03-14 (×2): via INTRAVENOUS

## 2015-03-14 MED ORDER — METOCLOPRAMIDE HCL 5 MG/ML IJ SOLN
5.0000 mg | Freq: Three times a day (TID) | INTRAMUSCULAR | Status: DC | PRN
  Filled 2015-03-14: qty 2

## 2015-03-14 MED ORDER — PHENYLEPHRINE HCL 10 MG/ML IJ SOLN: 30.0000 ug/min | INTRAVENOUS | Status: DC

## 2015-03-14 MED ORDER — MIDAZOLAM HCL 2 MG/2ML IJ SOLN
INTRAMUSCULAR | Status: AC
  Filled 2015-03-14: qty 2

## 2015-03-14 MED ORDER — PHENYLEPHRINE HCL 10 MG/ML IJ SOLN
INTRAMUSCULAR | Status: AC
  Filled 2015-03-14: qty 1

## 2015-03-14 MED ORDER — ATORVASTATIN CALCIUM 80 MG PO TABS
80.0000 mg | ORAL_TABLET | Freq: Every evening | ORAL | Status: DC
  Administered 2015-03-14 – 2015-03-16 (×3): 80 mg via ORAL
  Filled 2015-03-14 (×4): qty 1

## 2015-03-14 MED ORDER — ACETAMINOPHEN 500 MG PO TABS
1000.0000 mg | ORAL_TABLET | Freq: Four times a day (QID) | ORAL | Status: AC
  Administered 2015-03-14 – 2015-03-15 (×3): 1000 mg via ORAL
  Filled 2015-03-14 (×3): qty 2

## 2015-03-14 MED ORDER — RIVAROXABAN 10 MG PO TABS
10.0000 mg | ORAL_TABLET | Freq: Every day | ORAL | Status: DC
  Administered 2015-03-15 – 2015-03-17 (×3): 10 mg via ORAL
  Filled 2015-03-14 (×4): qty 1

## 2015-03-14 MED ORDER — BUPIVACAINE IN DEXTROSE 0.75-8.25 % IT SOLN
INTRATHECAL | Status: DC | PRN
  Administered 2015-03-14: 2 mL via INTRATHECAL

## 2015-03-14 MED ORDER — TRAMADOL HCL 50 MG PO TABS: 50.0000 mg | ORAL_TABLET | Freq: Four times a day (QID) | ORAL | Status: DC | PRN

## 2015-03-14 MED ORDER — KETOROLAC TROMETHAMINE 15 MG/ML IJ SOLN: 7.5000 mg | Freq: Four times a day (QID) | INTRAMUSCULAR | Status: AC | PRN

## 2015-03-14 MED ORDER — CEFAZOLIN SODIUM-DEXTROSE 2-3 GM-% IV SOLR
2.0000 g | Freq: Four times a day (QID) | INTRAVENOUS | Status: AC
  Administered 2015-03-14 (×2): 2 g via INTRAVENOUS
  Filled 2015-03-14 (×2): qty 50

## 2015-03-14 MED ORDER — OXYCODONE HCL 5 MG PO TABS
5.0000 mg | ORAL_TABLET | ORAL | Status: DC | PRN
  Administered 2015-03-14 (×3): 5 mg via ORAL
  Administered 2015-03-15 – 2015-03-16 (×6): 10 mg via ORAL
  Filled 2015-03-14 (×3): qty 2
  Filled 2015-03-14: qty 1
  Filled 2015-03-14: qty 2
  Filled 2015-03-14 (×2): qty 1
  Filled 2015-03-14 (×3): qty 2

## 2015-03-14 MED ORDER — METHOCARBAMOL 1000 MG/10ML IJ SOLN
500.0000 mg | Freq: Four times a day (QID) | INTRAMUSCULAR | Status: DC | PRN
  Administered 2015-03-14: 500 mg via INTRAVENOUS
  Filled 2015-03-14 (×2): qty 5

## 2015-03-14 MED ORDER — DOCUSATE SODIUM 100 MG PO CAPS
100.0000 mg | ORAL_CAPSULE | Freq: Two times a day (BID) | ORAL | Status: DC
  Administered 2015-03-14 – 2015-03-17 (×6): 100 mg via ORAL

## 2015-03-14 MED ORDER — MORPHINE SULFATE 2 MG/ML IJ SOLN
1.0000 mg | INTRAMUSCULAR | Status: DC | PRN
Start: 2015-03-14 — End: 2015-03-17

## 2015-03-14 MED ORDER — DIPHENHYDRAMINE HCL 12.5 MG/5ML PO ELIX: 12.5000 mg | ORAL_SOLUTION | ORAL | Status: DC | PRN

## 2015-03-14 MED ORDER — BISACODYL 10 MG RE SUPP: 10.0000 mg | Freq: Every day | RECTAL | Status: DC | PRN

## 2015-03-14 MED ORDER — ONDANSETRON HCL 4 MG PO TABS: 4.0000 mg | ORAL_TABLET | Freq: Four times a day (QID) | ORAL | Status: DC | PRN

## 2015-03-14 MED ORDER — EZETIMIBE 10 MG PO TABS
10.0000 mg | ORAL_TABLET | Freq: Every morning | ORAL | Status: DC
Start: 2015-03-14 — End: 2015-03-17
  Administered 2015-03-14 – 2015-03-17 (×4): 10 mg via ORAL
  Filled 2015-03-14 (×4): qty 1

## 2015-03-14 MED ORDER — METHOCARBAMOL 500 MG PO TABS
500.0000 mg | ORAL_TABLET | Freq: Four times a day (QID) | ORAL | Status: DC | PRN
  Administered 2015-03-14 – 2015-03-16 (×5): 500 mg via ORAL
  Filled 2015-03-14 (×5): qty 1

## 2015-03-14 MED ORDER — DEXAMETHASONE SODIUM PHOSPHATE 10 MG/ML IJ SOLN
INTRAMUSCULAR | Status: AC
  Filled 2015-03-14: qty 1

## 2015-03-14 MED ORDER — FENTANYL CITRATE 0.05 MG/ML IJ SOLN
INTRAMUSCULAR | Status: AC
  Filled 2015-03-14: qty 2

## 2015-03-14 MED ORDER — PROPOFOL 10 MG/ML IV BOLUS
INTRAVENOUS | Status: DC | PRN
  Administered 2015-03-14 (×4): 20 mg via INTRAVENOUS
  Administered 2015-03-14: 40 mg via INTRAVENOUS

## 2015-03-14 MED ORDER — SODIUM CHLORIDE 0.9 % IR SOLN
Status: DC | PRN
  Administered 2015-03-14: 1000 mL

## 2015-03-14 MED ORDER — SODIUM CHLORIDE 0.9 % IV SOLN: INTRAVENOUS | Status: DC

## 2015-03-14 MED ORDER — BUPIVACAINE LIPOSOME 1.3 % IJ SUSP
20.0000 mL | Freq: Once | INTRAMUSCULAR | Status: DC
  Filled 2015-03-14: qty 20

## 2015-03-14 MED ORDER — SODIUM CHLORIDE 0.9 % IJ SOLN
INTRAMUSCULAR | Status: AC
  Filled 2015-03-14: qty 50

## 2015-03-14 MED ORDER — METOCLOPRAMIDE HCL 10 MG PO TABS: 5.0000 mg | ORAL_TABLET | Freq: Three times a day (TID) | ORAL | Status: DC | PRN

## 2015-03-14 MED ORDER — LEVOTHYROXINE SODIUM 75 MCG PO TABS
75.0000 ug | ORAL_TABLET | ORAL | Status: DC
  Administered 2015-03-15 – 2015-03-17 (×2): 75 ug via ORAL
  Filled 2015-03-14 (×2): qty 1

## 2015-03-14 MED ORDER — MIDAZOLAM HCL 5 MG/5ML IJ SOLN
INTRAMUSCULAR | Status: DC | PRN
  Administered 2015-03-14: 2 mg via INTRAVENOUS

## 2015-03-14 MED ORDER — CEFAZOLIN SODIUM-DEXTROSE 2-3 GM-% IV SOLR
2.0000 g | INTRAVENOUS | Status: AC
  Administered 2015-03-14: 2 g via INTRAVENOUS

## 2015-03-14 MED ORDER — FENTANYL CITRATE 0.05 MG/ML IJ SOLN
INTRAMUSCULAR | Status: DC | PRN
  Administered 2015-03-14: 50 ug via INTRAVENOUS

## 2015-03-14 SURGICAL SUPPLY — 71 items
BAG DECANTER FOR FLEXI CONT (MISCELLANEOUS) ×1 IMPLANT
BAG SPEC THK2 15X12 ZIP CLS (MISCELLANEOUS) ×1
BAG ZIPLOCK 12X15 (MISCELLANEOUS) ×3 IMPLANT
BANDAGE ELASTIC 6 VELCRO ST LF (GAUZE/BANDAGES/DRESSINGS) ×3 IMPLANT
BANDAGE ESMARK 6X9 LF (GAUZE/BANDAGES/DRESSINGS) ×1 IMPLANT
BLADE SAG 18X100X1.27 (BLADE) ×3 IMPLANT
BLADE SAW SGTL 11.0X1.19X90.0M (BLADE) ×3 IMPLANT
BNDG CMPR 9X6 STRL LF SNTH (GAUZE/BANDAGES/DRESSINGS) ×1
BNDG ESMARK 6X9 LF (GAUZE/BANDAGES/DRESSINGS) ×3
BOWL SMART MIX CTS (DISPOSABLE) ×3 IMPLANT
CAP KNEE TOTAL 3 SIGMA ×2 IMPLANT
CEMENT HV SMART SET (Cement) ×6 IMPLANT
CLOSURE WOUND 1/2 X4 (GAUZE/BANDAGES/DRESSINGS) ×1
CUFF TOURN SGL QUICK 34 (TOURNIQUET CUFF) ×3
CUFF TRNQT CYL 34X4X40X1 (TOURNIQUET CUFF) ×1 IMPLANT
DECANTER SPIKE VIAL GLASS SM (MISCELLANEOUS) ×3 IMPLANT
DRAPE EXTREMITY T 121X128X90 (DRAPE) ×3 IMPLANT
DRAPE POUCH INSTRU U-SHP 10X18 (DRAPES) ×3 IMPLANT
DRAPE U-SHAPE 47X51 STRL (DRAPES) ×3 IMPLANT
DRSG ADAPTIC 3X8 NADH LF (GAUZE/BANDAGES/DRESSINGS) ×3 IMPLANT
DRSG PAD ABDOMINAL 8X10 ST (GAUZE/BANDAGES/DRESSINGS) ×3 IMPLANT
DURAPREP 26ML APPLICATOR (WOUND CARE) ×3 IMPLANT
ELECT REM PT RETURN 9FT ADLT (ELECTROSURGICAL) ×3
ELECTRODE REM PT RTRN 9FT ADLT (ELECTROSURGICAL) ×1 IMPLANT
EVACUATOR 1/8 PVC DRAIN (DRAIN) ×3 IMPLANT
FACESHIELD WRAPAROUND (MASK) ×12 IMPLANT
FACESHIELD WRAPAROUND OR TEAM (MASK) ×5 IMPLANT
GAUZE SPONGE 4X4 12PLY STRL (GAUZE/BANDAGES/DRESSINGS) ×3 IMPLANT
GLOVE BIO SURGEON STRL SZ7 (GLOVE) ×2 IMPLANT
GLOVE BIO SURGEON STRL SZ7.5 (GLOVE) ×2 IMPLANT
GLOVE BIO SURGEON STRL SZ8 (GLOVE) ×5 IMPLANT
GLOVE BIOGEL PI IND STRL 6.5 (GLOVE) IMPLANT
GLOVE BIOGEL PI IND STRL 7.0 (GLOVE) IMPLANT
GLOVE BIOGEL PI IND STRL 7.5 (GLOVE) IMPLANT
GLOVE BIOGEL PI IND STRL 8 (GLOVE) ×1 IMPLANT
GLOVE BIOGEL PI IND STRL 8.5 (GLOVE) IMPLANT
GLOVE BIOGEL PI INDICATOR 6.5 (GLOVE)
GLOVE BIOGEL PI INDICATOR 7.0 (GLOVE) ×2
GLOVE BIOGEL PI INDICATOR 7.5 (GLOVE) ×2
GLOVE BIOGEL PI INDICATOR 8 (GLOVE) ×4
GLOVE BIOGEL PI INDICATOR 8.5 (GLOVE) ×2
GLOVE SURG SS PI 6.5 STRL IVOR (GLOVE) IMPLANT
GOWN STRL REUS W/TWL LRG LVL3 (GOWN DISPOSABLE) ×3 IMPLANT
GOWN STRL REUS W/TWL XL LVL3 (GOWN DISPOSABLE) ×6 IMPLANT
HANDPIECE INTERPULSE COAX TIP (DISPOSABLE) ×3
IMMOBILIZER KNEE 20 (SOFTGOODS) ×3
IMMOBILIZER KNEE 20 THIGH 36 (SOFTGOODS) ×1 IMPLANT
KIT BASIN OR (CUSTOM PROCEDURE TRAY) ×3 IMPLANT
MANIFOLD NEPTUNE II (INSTRUMENTS) ×3 IMPLANT
NDL SAFETY ECLIPSE 18X1.5 (NEEDLE) ×2 IMPLANT
NEEDLE HYPO 18GX1.5 SHARP (NEEDLE) ×6
NS IRRIG 1000ML POUR BTL (IV SOLUTION) ×3 IMPLANT
PACK TOTAL JOINT (CUSTOM PROCEDURE TRAY) ×3 IMPLANT
PADDING CAST COTTON 6X4 STRL (CAST SUPPLIES) ×5 IMPLANT
PEN SKIN MARKING BROAD (MISCELLANEOUS) ×3 IMPLANT
POSITIONER SURGICAL ARM (MISCELLANEOUS) ×3 IMPLANT
SET HNDPC FAN SPRY TIP SCT (DISPOSABLE) ×1 IMPLANT
STRIP CLOSURE SKIN 1/2X4 (GAUZE/BANDAGES/DRESSINGS) ×3 IMPLANT
SUCTION FRAZIER 12FR DISP (SUCTIONS) ×3 IMPLANT
SUT MNCRL AB 4-0 PS2 18 (SUTURE) ×3 IMPLANT
SUT VIC AB 2-0 CT1 27 (SUTURE) ×9
SUT VIC AB 2-0 CT1 TAPERPNT 27 (SUTURE) ×3 IMPLANT
SUT VLOC 180 0 24IN GS25 (SUTURE) ×3 IMPLANT
SYR 20CC LL (SYRINGE) ×3 IMPLANT
SYR 50ML LL SCALE MARK (SYRINGE) ×3 IMPLANT
TOWEL OR 17X26 10 PK STRL BLUE (TOWEL DISPOSABLE) ×3 IMPLANT
TOWEL OR NON WOVEN STRL DISP B (DISPOSABLE) ×2 IMPLANT
TRAY FOLEY CATH 14FRSI W/METER (CATHETERS) ×3 IMPLANT
WATER STERILE IRR 1500ML POUR (IV SOLUTION) ×3 IMPLANT
WRAP KNEE MAXI GEL POST OP (GAUZE/BANDAGES/DRESSINGS) ×3 IMPLANT
YANKAUER SUCT BULB TIP 10FT TU (MISCELLANEOUS) ×3 IMPLANT

## 2015-03-14 NOTE — Progress Notes (Signed)
Utilization review completed.  

## 2015-03-14 NOTE — Interval H&P Note (Signed)
History and Physical Interval Note:  03/14/2015 6:45 AM  Lori Meyers  has presented today for surgery, with the diagnosis of LEFT KNEE OA   The various methods of treatment have been discussed with the patient and family. After consideration of risks, benefits and other options for treatment, the patient has consented to  Procedure(s): TOTAL LEFT KNEE ARTHROPLASTY (Left) as a surgical intervention .  The patient's history has been reviewed, patient examined, no change in status, stable for surgery.  I have reviewed the patient's chart and labs.  Questions were answered to the patient's satisfaction.     Loanne DrillingALUISIO,Linnae Rasool V

## 2015-03-14 NOTE — Anesthesia Postprocedure Evaluation (Signed)
  Anesthesia Post-op Note  Patient: Lori Meyers  Procedure(s) Performed: Procedure(s) (LRB): TOTAL LEFT KNEE ARTHROPLASTY (Left)  Patient Location: PACU  Anesthesia Type: Spinal  Level of Consciousness: awake and alert   Airway and Oxygen Therapy: Patient Spontanous Breathing  Post-op Pain: mild  Post-op Assessment: Post-op Vital signs reviewed, Patient's Cardiovascular Status Stable, Respiratory Function Stable, Patent Airway and No signs of Nausea or vomiting  Last Vitals:  Filed Vitals:   03/14/15 1015  BP: 122/71  Pulse: 65  Temp: 36.4 C  Resp: 13    Post-op Vital Signs: stable   Complications: No apparent anesthesia complications

## 2015-03-14 NOTE — Op Note (Signed)
Pre-operative diagnosis- Osteoarthritis  Left knee(s)  Post-operative diagnosis- Osteoarthritis Left knee(s)  Procedure-  Left  Total Knee Arthroplasty  Surgeon- Gus RankinFrank V. Sameka Bagent, MD  Assistant- Avel Peacerew Perkins, PA-C   Anesthesia-  Spinal  EBL-* No blood loss amount entered *   Drains Hemovac  Tourniquet time-  Total Tourniquet Time Documented: Thigh (Left) - 27 minutes Total: Thigh (Left) - 27 minutes     Complications- None  Condition-PACU - hemodynamically stable.   Brief Clinical Note  Lori Meyers is a 74 y.o. year old female with end stage OA of her left knee with progressively worsening pain and dysfunction. She has constant pain, with activity and at rest and significant functional deficits with difficulties even with ADLs. She has had extensive non-op management including analgesics, injections of cortisone and viscosupplements, and home exercise program, but remains in significant pain with significant dysfunction. Radiographs show bone on bone arthritis medial and patellofemoral. She presents now for left Total Knee Arthroplasty.    Procedure in detail---   The patient is brought into the operating room and positioned supine on the operating table. After successful administration of  Spinal,   a tourniquet is placed high on the  Left thigh(s) and the lower extremity is prepped and draped in the usual sterile fashion. Time out is performed by the operating team and then the  Left lower extremity is wrapped in Esmarch, knee flexed and the tourniquet inflated to 300 mmHg.       A midline incision is made with a ten blade through the subcutaneous tissue to the level of the extensor mechanism. A fresh blade is used to make a medial parapatellar arthrotomy. Soft tissue over the proximal medial tibia is subperiosteally elevated to the joint line with a knife and into the semimembranosus bursa with a Cobb elevator. Soft tissue over the proximal lateral tibia is elevated with  attention being paid to avoiding the patellar tendon on the tibial tubercle. The patella is everted, knee flexed 90 degrees and the ACL and PCL are removed. Findings are bone on bone medial and patellofemoral with large global osteophytes.       The drill is used to create a starting hole in the distal femur and the canal is thoroughly irrigated with sterile saline to remove the fatty contents. The 5 degree Left  valgus alignment guide is placed into the femoral canal and the distal femoral cutting block is pinned to remove 10 mm off the distal femur. Resection is made with an oscillating saw.      The tibia is subluxed forward and the menisci are removed. The extramedullary alignment guide is placed referencing proximally at the medial aspect of the tibial tubercle and distally along the second metatarsal axis and tibial crest. The block is pinned to remove 2mm off the more deficient medial  side. Resection is made with an oscillating saw. Size 3is the most appropriate size for the tibia and the proximal tibia is prepared with the modular drill and keel punch for that size.      The femoral sizing guide is placed and size 3 is most appropriate. Rotation is marked off the epicondylar axis and confirmed by creating a rectangular flexion gap at 90 degrees. The size 3 cutting block is pinned in this rotation and the anterior, posterior and chamfer cuts are made with the oscillating saw. The intercondylar block is then placed and that cut is made.      Trial size 3 tibial component, trial  size 3 posterior stabilized femur and a 10  mm posterior stabilized rotating platform insert trial is placed. Full extension is achieved with excellent varus/valgus and anterior/posterior balance throughout full range of motion. The patella is everted and thickness measured to be 22  mm. Free hand resection is taken to 12 mm, a 35 template is placed, lug holes are drilled, trial patella is placed, and it tracks normally.  Osteophytes are removed off the posterior femur with the trial in place. All trials are removed and the cut bone surfaces prepared with pulsatile lavage. Cement is mixed and once ready for implantation, the size 3 tibial implant, size  3 posterior stabilized femoral component, and the size 35 patella are cemented in place and the patella is held with the clamp. The trial insert is placed and the knee held in full extension. The Exparel (20 ml mixed with 30 ml saline) and .25% Bupivicaine, are injected into the extensor mechanism, posterior capsule, medial and lateral gutters and subcutaneous tissues.  All extruded cement is removed and once the cement is hard the permanent 10 mm posterior stabilized rotating platform insert is placed into the tibial tray.      The wound is copiously irrigated with saline solution and the extensor mechanism closed over a hemovac drain with #1 V-loc suture. The tourniquet is released for a total tourniquet time of 27  minutes. Flexion against gravity is 140 degrees and the patella tracks normally. Subcutaneous tissue is closed with 2.0 vicryl and subcuticular with running 4.0 Monocryl. The incision is cleaned and dried and steri-strips and a bulky sterile dressing are applied. The limb is placed into a knee immobilizer and the patient is awakened and transported to recovery in stable condition.      Please note that a surgical assistant was a medical necessity for this procedure in order to perform it in a safe and expeditious manner. Surgical assistant was necessary to retract the ligaments and vital neurovascular structures to prevent injury to them and also necessary for proper positioning of the limb to allow for anatomic placement of the prosthesis.   Gus Rankin Lori Wahab, MD    03/14/2015, 8:44 AM

## 2015-03-14 NOTE — Progress Notes (Signed)
Clinical Social Work Department CLINICAL SOCIAL WORK PLACEMENT NOTE 03/14/2015  Patient:  Lori Meyers,Lori Meyers  Account Number:  [id number] Admit date:  03/14/2015  Clinical Social Worker:  Cori RazorJAMIE Linell Meldrum, LCSW  Date/time:  03/14/2015 01:22 PM  Clinical Social Work is seeking post-discharge placement for this patient at the following level of care:   SKILLED NURSING   (*CSW will update this form in Epic as items are completed)     Patient/family provided with [facility] of Clinical Social Work's list of facilities offering this level of care within the geographic area requested by the patient (or if unable, by the patient's family).  03/14/2015  Patient/family informed of their freedom to choose among providers that offer the needed level of care, that participate in Medicare, Medicaid or managed care program needed by the patient, have an available bed and are willing to accept the patient.  03/14/2015  Patient/family informed of [facility]' ownership interest in [facility], as well as of the fact that they are under no obligation to receive care at this facility.  PASARR submitted to EDS on 03/14/2015 PASARR number received on 03/14/2015  FL2 transmitted to all facilities in geographic area requested by pt/family on  03/14/2015 FL2 transmitted to all facilities within larger geographic area on   Patient informed that his/her managed care company has contracts with or will negotiate with  certain facilities, including the following:     Patient/family informed of bed offers received:  03/14/2015 [facility] Physician recommends and patient chooses bed at    Patient to be transferred to  on   Patient to be transferred to facility by  Patient and family notified of transfer on  Name of family member notified:    The following physician request were entered in Epic:   Additional Comments:  Cori RazorJamie Jodi Kappes LCSW [phone]

## 2015-03-14 NOTE — H&P (View-Only) (Signed)
Lori Meyers DOB: 04/26/1941 Married / Language: English / Race: White Female Date of Admission:  03/14/2015 CC:  Left Knee Pain History of Present Illness The patient is a 73 year old female who comes in for a preoperative History and Physical. The patient is scheduled for a left total knee arthroplasty to be performed by Dr. Frank V. Aluisio, MD at [facility] on 03-14-2015. The patient is a 73 year old who comes in several months out from right total knee arthroplasty. The patient states that she is doing well (some sorness) at this time. The pain is under excellent control at this time and describe their pain as mild (at times ). They are currently on no medication for their pain. The patient is currently doing home exercise program (she has d/c and now is doing water aerobics). Patient is still complaining of left knee pain that has progressed with pain. it has not gotten as bad as the right was prior to that surgery but it has been progressively getting worse with time. The left knee is problematic for her. She is having a lot of medial sided pain. This occasionally feels like it wants to give out. The pain is not as intense as the right knee was prior to the surgery but is still problematic for her. She is ready now to get the other knee done at this time. She elects to proceed with the left total knee replacement. They have been treated conservatively in the past for the above stated problem and despite conservative measures, they continue to have progressive pain and severe functional limitations and dysfunction. They have failed non-operative management including home exercise, medications. It is felt that they would benefit from undergoing total joint replacement. Risks and benefits of the procedure have been discussed with the patient and they elect to proceed with surgery. There are no active contraindications to surgery such as ongoing infection or rapidly progressive neurological  disease.  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 First Degree Relatives reported Heart Disease Maternal Grandmother, Mother, Paternal Grandmother. Heart disease in female family member before age 65 Congestive Heart Failure Maternal Grandmother, Mother, Paternal Grandfather, Paternal Grandmother. Cerebrovascular Accident Maternal Grandfather. Father Deceased. age 92, "old age" Mother Heart Attack  Social History  Tobacco / smoke exposure 01/23/2014: no Never consumed alcohol 01/23/2014: Never consumed alcohol Current work status retired Tobacco use Never smoker. 01/23/2014 Not under pain contract No history of drug/alcohol rehab Number of flights of stairs before winded 2-3 Children 2 Exercise Exercises weekly; does other Marital status married Living situation live with spouse Advance Directives Healthcare POA Post-Surgical Plans Wants to go to [facility] again.  Medication History  Valium (5MG Tablet, 1/2-1 Tablet Oral every six hours, as needed, Taken starting 12/17/2014) Active. Methocarbamol (500MG Tablet, Oral) Active. OxyCODONE HCl (10MG Tablet, Oral) Active. CeleBREX (200MG Capsule, Oral) Active. Glucosamine Chondr 500 Complex (Oral) Active. Multivitamin (Oral) Active. Cytomel (5MCG Tablet, Oral) Active. Lipitor (40MG Tablet, Oral) Active. Synthroid (50MCG Tablet, Oral) Active. Zetia (10MG Tablet, Oral) Active.  Past Surgical History Tonsillectomy Thyroidectomy; Total Appendectomy Date: 01/1951. Arthroscopy of Knee Date: 04/2011. left Hysterectomy Date: 1998. complete (non-cancerous) Right Middle Finger Joint Replacement Date: 2005. Total Knee Replacement -  Right Date: 05/2014.   Review of Systems General Not Present- Chills, Fatigue, Fever, Memory Loss, Night Sweats,   Weight Gain and Weight Loss. Skin Not Present- Eczema, Hives, Itching, Lesions and Rash. HEENT Present- Hearing Loss. Not Present- Dentures, Double Vision, Headache, Tinnitus and Visual Loss. Respiratory Not Present- Allergies, Chronic Cough, Coughing up blood, Shortness of breath at rest and Shortness of breath with exertion. Cardiovascular Not Present- Chest Pain, Difficulty Breathing Lying Down, Murmur, Palpitations, Racing/skipping heartbeats and Swelling. Gastrointestinal Not Present- Abdominal Pain, Bloody Stool, Constipation, Diarrhea, Difficulty Swallowing, Heartburn, Jaundice, Loss of appetitie, Nausea and Vomiting. Musculoskeletal Present- Joint Pain and Joint Swelling. Not Present- Back Pain, Morning Stiffness, Muscle Pain, Muscle Weakness and Spasms. Neurological Not Present- Blackout spells, Difficulty with balance, Dizziness, Paralysis, Tremor and Weakness. Psychiatric Not Present- Insomnia.   Vitals  Weight: 170 lb Height: 63.5in Weight was reported by patient. Height was reported by patient. Body Surface Area: 1.82 m Body Mass Index: 29.64 kg/m  BP: 146/82 (Sitting, Right Arm, Standard)  Physical Exam (Jamela Cumbo L. Remus Hagedorn III PA-C; 03/13/2015 2:43 PM) General Mental Status -Alert, cooperative and good historian. General Appearance-pleasant, Not in acute distress. Orientation-Oriented X3. Build & Nutrition-Well nourished and Well developed.  Head and Neck Head-normocephalic, atraumatic . Neck Global Assessment - supple, no bruit auscultated on the right, no bruit auscultated on the left.  Eye Pupil - Bilateral-Regular and Round. Motion - Bilateral-EOMI.  Chest and Lung Exam Auscultation Breath sounds - clear at anterior chest wall and clear at posterior chest wall. Adventitious sounds - No Adventitious  sounds.  Cardiovascular Auscultation Rhythm - Regular rate and rhythm. Heart Sounds - S1 WNL and S2 WNL. Murmurs & Other Heart Sounds - Auscultation of the heart reveals - No Murmurs.  Abdomen Palpation/Percussion Tenderness - Abdomen is non-tender to palpation. Rigidity (guarding) - Abdomen is soft. Auscultation Auscultation of the abdomen reveals - Bowel sounds normal.  Female Genitourinary Note: Not done, not pertinent to present illness   Musculoskeletal Note: She is alert and oriented. No apparent distress. The right knee looks great. The range of motion on the right is about 5-115. There is no tenderness or instability on the right knee. The left knee shows a varus deformity. Range is 5-125. There is marked crepitus on range of motion, tenderness, medial greater than lateral with no instability noted.  RADIOGRAPHS: X-rays from her right knee taken in July and she has bone on bone in the medial and patellofemoral compartments.   Assessment & Plan  Primary osteoarthritis of left knee (M17.12) Note:Surgical Plans: Left Total Knee Repalcement  Disposition: [facility]  PCP: Dr. Perini - Patient has been seen preoperatively and felt to be stable for surgery.  IV TXA  Anesthesia Issues: None  Signed electronically by Gari Hartsell L Nana Vastine, III PA-C 

## 2015-03-14 NOTE — Progress Notes (Signed)
Clinical Social Work Department BRIEF PSYCHOSOCIAL ASSESSMENT 03/14/2015  Patient:  Lori Meyers, Lori Meyers     Account Number:  [id number]     Admit date:  03/14/2015  Clinical Social Worker:  Lacie Scotts  Date/Time:  03/14/2015 01:08 PM  Referred by:  Physician  Date Referred:  03/14/2015 Referred for  SNF Placement   Other Referral:   Interview type:  Patient Other interview type:    PSYCHOSOCIAL DATA Living Status:  HUSBAND Admitted from facility:   Level of care:   Primary support name:  Mac Primary support relationship to patient:  SPOUSE Degree of support available:   supportive    CURRENT CONCERNS Current Concerns  Post-Acute Placement   Other Concerns:    SOCIAL WORK ASSESSMENT / PLAN Pt is a 74 yr old female living at home prior to hospitalization. CSW met with pt to assist with d/c planning. This is a planned admission. Pt has made prior arrangements to have [facility] Re[facility] at [facility] following hospital d/c . CSW has contacted SNF and d/c plans have been confirmed. CSW will continue to follow to assist with d/c planning to SNF.   Assessment/plan status:  Psychosocial Support/Ongoing Assessment of Needs Other assessment/ plan:   Information/referral to community resources:   Insurance coverage for SNF and ambulance transport reviewed.    PATIENT'S/FAMILY'S RESPONSE TO PLAN OF CARE: " I'm just starting to feel some pain."  Nsg alerted and will assist. Pt is pleased surgery is over. " I started my morning by losing a crown. I was worried I wouldn't be able to have my surgery." Pt's mood is calm. Pt is motivated to work with therapy. She had surgery last June and went to [facility] for rehab and had a good experience. " I know all surgeries are different but I think recovery will be easier with this one." Encouragement and reassurance provided.    Werner Lean LCSW [phone]

## 2015-03-14 NOTE — Anesthesia Procedure Notes (Signed)
Spinal Patient location during procedure: OR Start time: 03/14/2015 7:50 AM End time: 03/14/2015 7:54 AM Reason for block: at surgeon's request Staffing Resident/CRNA: Anne Fu Performed by: resident/CRNA  Preanesthetic Checklist Completed: patient identified, site marked, surgical consent, pre-op evaluation, timeout performed, IV checked, risks and benefits discussed, monitors and equipment checked and at surgeon's request Spinal Block Patient position: sitting Approach: right paramedian Location: L3-4 Injection technique: single-shot Needle Needle type: Spinocan  Needle gauge: 22 G Needle length: 9 cm Assessment Sensory level: T6 Additional Notes Expiration date of kit checked and confirmed. Patient tolerated procedure well, without complications. X 1 attempt with noted clear CSF return. Loss of motor and sensory on exam post injection.

## 2015-03-14 NOTE — Plan of Care (Signed)
Problem: Consults Goal: Diagnosis- Total Joint Replacement Primary Total Knee     

## 2015-03-14 NOTE — Transfer of Care (Signed)
Immediate Anesthesia Transfer of Care Note  Patient: Lori Meyers  Procedure(s) Performed: Procedure(s) (LRB): TOTAL LEFT KNEE ARTHROPLASTY (Left)  Patient Location: PACU  Anesthesia Type: Spinal  Level of Consciousness: sedated, patient cooperative and responds to stimulation  Airway & Oxygen Therapy: Patient Spontanous Breathing and Patient connected to face mask oxgen  Post-op Assessment: Report given to PACU RN and Post -op Vital signs reviewed and stable  Post vital signs: Reviewed and stable  Complications: No apparent anesthesia complications T-12 level on exam, denied pain on assessment.

## 2015-03-15 LAB — CBC
HCT: 36 % (ref 36.0–46.0)
HEMOGLOBIN: 12 g/dL (ref 12.0–15.0)
MCH: 29.9 pg (ref 26.0–34.0)
MCHC: 33.3 g/dL (ref 30.0–36.0)
MCV: 89.8 fL (ref 78.0–100.0)
Platelets: 170 10*3/uL (ref 150–400)
RBC: 4.01 MIL/uL (ref 3.87–5.11)
RDW: 13.1 % (ref 11.5–15.5)
WBC: 13.4 10*3/uL — AB (ref 4.0–10.5)

## 2015-03-15 LAB — BASIC METABOLIC PANEL
ANION GAP: 8 (ref 5–15)
BUN: 15 mg/dL (ref 6–23)
CHLORIDE: 107 mmol/L (ref 96–112)
CO2: 23 mmol/L (ref 19–32)
Calcium: 8.6 mg/dL (ref 8.4–10.5)
Creatinine, Ser: 0.57 mg/dL (ref 0.50–1.10)
GFR calc Af Amer: >90 mL/min (ref >90)
GFR calc non Af Amer: 90 mL/min — ABNORMAL LOW (ref >90)
GLUCOSE: 124 mg/dL — AB (ref 70–99)
POTASSIUM: 3.7 mmol/L (ref 3.5–5.1)
Sodium: 138 mmol/L (ref 135–145)

## 2015-03-15 MED ORDER — RIVAROXABAN 10 MG PO TABS: 10.0000 mg | ORAL_TABLET | Freq: Every day | ORAL | Status: DC

## 2015-03-15 MED ORDER — OXYCODONE HCL 5 MG PO TABS: 5.0000 mg | ORAL_TABLET | ORAL | Status: DC | PRN

## 2015-03-15 MED ORDER — METHOCARBAMOL 500 MG PO TABS: 500.0000 mg | ORAL_TABLET | Freq: Four times a day (QID) | ORAL | Status: DC | PRN

## 2015-03-15 MED ORDER — TRAMADOL HCL 50 MG PO TABS: 50.0000 mg | ORAL_TABLET | Freq: Four times a day (QID) | ORAL | Status: DC | PRN

## 2015-03-15 NOTE — Progress Notes (Signed)
   Subjective: 1 Day Post-Op Procedure(s) (LRB): TOTAL LEFT KNEE ARTHROPLASTY (Left) Patient reports pain as mild.   Patient seen in rounds with Dr. Lequita HaltAluisio.  Doing well this morning. Very little pain. Patient is well, and has had no acute complaints or problems We will start therapy today.  [facility] hospital stay.  Objective: Vital signs in last 24 hours: Temp:  [97.2 F (36.2 C)-98.4 F (36.9 C)] 97.9 F (36.6 C) (04/05 0624) Pulse Rate:  [65-91] 71 (04/05 0624) Resp:  [13-21] 16 (04/05 0624) BP: (101-143)/(51-86) 143/60 mmHg (04/05 0624) SpO2:  [94 %-100 %] 100 % (04/05 0624)  Intake/Output from previous day:  Intake/Output Summary (Last 24 hours) at 03/15/15 0755 Last data filed at 03/15/15 0653  Gross per 24 hour  Intake 4256.25 ml  Output   4056 ml  Net 200.25 ml    Intake/Output this shift: UOP about 500 since MN  Labs:  Recent Labs  03/15/15 0525  HGB 12.0    Recent Labs  03/15/15 0525  WBC 13.4*  RBC 4.01  HCT 36.0  PLT 170    Recent Labs  03/15/15 0525  NA 138  K 3.7  CL 107  CO2 23  BUN 15  CREATININE 0.57  GLUCOSE 124*  CALCIUM 8.6   No results for input(s): LABPT, INR in the last 72 hours.  EXAM General - Patient is Alert, Appropriate and Oriented Extremity - Neurovascular intact Sensation intact distally Dorsiflexion/Plantar flexion intact Dressing - dressing C/D/I Motor Function - intact, moving foot and toes well on exam.  Hemovac pulled without difficulty.  Past Medical History  Diagnosis Date  . Hypercholesteremia     under control  . Hypothyroidism     thyroidectomy-under control  . Arthritis     arms, knees  . Bursitis of left knee     Assessment/Plan: 1 Day Post-Op Procedure(s) (LRB): TOTAL LEFT KNEE ARTHROPLASTY (Left) Principal Problem:   OA (osteoarthritis) of knee  Estimated body mass index is 30.83 kg/(m^2) as calculated from the following:   Height as of this encounter: 5'  3" (1.6 m).   Weight as of this encounter: 78.926 kg (174 lb). Advance diet Up with therapy Discharge to SNF stable and bed available  DVT Prophylaxis - Xarelto Weight-Bearing as tolerated to left leg D/C O2 and Pulse OX and try on Room Air  Avel Peacerew Mahlon Gabrielle, PA-C Orthopaedic Surgery 03/15/2015, 7:55 AM

## 2015-03-15 NOTE — Evaluation (Signed)
Occupational Therapy Evaluation and Discharge Summary Patient Details Name: Lori Meyers MRN: [id number] DOB: 01/29/1941 Today's Date: 03/15/2015    History of Present Illness Pt is a 74 year old female s/p L TKA with hx of R TKA   Clinical Impression   Pt admitted for the above surgery and is planning [facility].  Feel this d/c is appropriate and all further AD[facility]  Pt progressing well with adls and is familiar with adl techniques from having other knee operated on last June.    Follow Up Recommendations  SNF;Supervision/Assistance - 24 hour    Equipment Recommendations  None recommended by OT    Recommendations for Other Services       Precautions / Restrictions Precautions Precautions: Knee Required Braces or Orthoses: Knee Immobilizer - Left Knee Immobilizer - Left: Discontinue once straight leg raise with < 10 degree lag Restrictions Weight Bearing Restrictions: No Other Position/Activity Restrictions: WBAT      Mobility Bed Mobility Overal bed mobility: Needs Assistance Bed Mobility: Supine to Sit;Sit to Supine     Supine to sit: Mod assist Sit to supine: Min assist   General bed mobility comments: assist with L leg out of and into bed.  HOB elevated.  Transfers Overall transfer level: Needs assistance Equipment used: Rolling walker (2 wheeled) Transfers: Sit to/from Stand Sit to Stand: Min assist         General transfer comment: Pt only agreed to sit to stand due to feeling nauseous.  Cues for hand placment and to put L leg out when sitting.    Balance Overall balance assessment: Needs assistance         Standing balance support: Bilateral upper extremity supported;During functional activity Standing balance-Leahy Scale: Poor Standing balance comment: Pt needs walker to balance self in standing.                            ADL Overall ADL's : Needs  assistance/impaired Eating/Feeding: Independent;Sitting   Grooming: Wash/dry hands;Wash/dry face;Set up;Sitting   Upper Body Bathing: Set up;Sitting   Lower Body Bathing: Moderate assistance;Sit to/from stand Lower Body Bathing Details (indicate cue type and reason): pt requires assist to stand and wash bottom and to wash lower legs. Upper Body Dressing : Set up;Sitting   Lower Body Dressing: Moderate assistance;Sit to/from stand   Toilet Transfer: Minimal assistance;Comfort height toilet;RW   Toileting- Clothing Manipulation and Hygiene: Minimal assistance;Sit to/from stand Toileting - Clothing Manipulation Details (indicate cue type and reason): min assist to stand and maintain balance to clean self.     Functional mobility during ADLs: Minimal assistance General ADL Comments: Pt doing well with adls. Pt with nausea this am limiting doing too much in the room.     Vision     Perception     Praxis      Pertinent Vitals/Pain Pain Assessment: 0-10 Pain Score: 5  Pain Location: L knee Pain Descriptors / Indicators: Sore;Aching Pain Intervention(s): Limited activity within patient's tolerance;Monitored during session;Premedicated before session;Ice applied     Hand Dominance Right   Extremity/Trunk Assessment Upper Extremity Assessment Upper Extremity Assessment: Overall WFL for tasks assessed   Lower Extremity Assessment Lower Extremity Assessment: LLE deficits/detail LLE Deficits / Details: fair quad contraction, maintained KI   Cervical / Trunk Assessment Cervical / Trunk Assessment: Normal   Communication Communication Communication: HOH   Cognition Arousal/Alertness: Awake/alert Behavior During Therapy:  WFL for tasks assessed/performed Overall Cognitive Status: Within Functional Limits for tasks assessed                     General Comments       Exercises       Shoulder Instructions      Home Living Family/patient expects to be discharged  to:: Skilled nursing facility Living Arrangements: Spouse/significant other                               Additional Comments: Pt plans to d/c to [facility] for short term rehab      Prior Functioning/Environment Level of Independence: Independent             OT Diagnosis: Generalized weakness;Acute pain   OT Problem List: Decreased knowledge of use of DME or AE;Decreased range of motion;Decreased activity tolerance;Impaired balance (sitting and/or standing);Pain   OT Treatment/Interventions:      OT Goals(Current goals can be found in the care plan section) Acute Rehab OT Goals Patient Stated Goal: to go to [facility] and then home w/o a walker OT Goal Formulation: All assessment and education complete, DC therapy  OT Frequency:     Barriers to D/C:            Co-evaluation              End of Session Equipment Utilized During Treatment: Rolling walker Nurse Communication: Mobility status  Activity Tolerance: Other (comment) (limited by nausea) Patient left: in bed;with call bell/phone within reach   Time: 1032-1045 OT Time Calculation (min): 13 min Charges:  OT General Charges $OT Visit: 1 Procedure OT Evaluation $Initial OT Evaluation Tier I: 1 Procedure G-Codes:    Hope Budds April 01, 2015, 10:55 AM  [phone]

## 2015-03-15 NOTE — Discharge Instructions (Addendum)
Dr. Gaynelle Arabian Total Joint Specialist [facility] [address] [place], [place] [address] [phone]  TOTAL KNEE REPLACEMENT POSTOPERATIVE DIRECTIONS  Knee Rehabilitation, Guidelines Following Surgery  Results after knee surgery are often greatly improved when you follow the exercise, range of motion and muscle strengthening exercises prescribed by your doctor. Safety measures are also important to protect the knee from further injury. Any time any of these exercises cause you to have increased pain or swelling in your knee joint, decrease the amount until you are comfortable again and slowly increase them. If you have problems or questions, call your caregiver or physical therapist for advice.   HOME CARE INSTRUCTIONS  Remove items at home which could result in a fall. This includes throw rugs or furniture in walking pathways.   ICE to the affected knee every three hours for 30 minutes at a time and then as needed for pain and swelling.  Continue to use ice on the knee for pain and swelling from surgery. You may notice swelling that will progress down to the foot and ankle.  This is normal after surgery.  Elevate the leg when you are not up walking on it.    Continue to use the breathing machine which will help keep your temperature down.  It is common for your temperature to cycle up and down following surgery, especially at night when you are not up moving around and exerting yourself.  The breathing machine keeps your lungs expanded and your temperature down.  Do not place pillow under knee, focus on keeping the knee straight while resting  DIET You may resume your previous home diet once your are discharged from the hospital.  DRESSING / WOUND CARE / SHOWERING You may change your dressing 3-5 days after surgery.  Then change the dressing every day with sterile gauze.  Please use good hand washing techniques before changing the dressing.  Do not use any  lotions or creams on the incision until instructed by your surgeon. You may start showering once you are discharged home but do not submerge the incision under water. Just pat the incision dry and apply a dry gauze dressing on daily. Change the surgical dressing daily and reapply a dry dressing each time.  ACTIVITY Walk with your walker as instructed. Use walker as long as suggested by your caregivers. Avoid periods of inactivity such as sitting longer than an hour when not asleep. This helps prevent blood clots.  You may resume a sexual relationship in one month or when given the OK by your doctor.  You may return to work once you are cleared by your doctor.  Do not drive a car for 6 weeks or until released by you surgeon.  Do not drive while taking narcotics.  WEIGHT BEARING Weight bearing as tolerated with assist device (walker, cane, etc) as directed, use it as long as suggested by your surgeon or therapist, typically at least 4-6 weeks.  POSTOPERATIVE CONSTIPATION PROTOCOL Constipation - defined medically as fewer than three stools per week and severe constipation as less than one stool per week.  One of the most common issues patients have following surgery is constipation.  Even if you have a regular bowel pattern at home, your normal regimen is likely to be disrupted due to multiple reasons following surgery.  Combination of anesthesia, postoperative narcotics, change in appetite and fluid intake all can affect your bowels.  In order to avoid complications following surgery, here are some recommendations  in order to help you during your recovery period. ° °Colace (docusate) - Pick up an over-the-counter form of Colace or another stool softener and take twice a day as long as you are requiring postoperative pain medications.  Take with a full glass of water daily.  If you experience loose stools or diarrhea, hold the colace until you stool forms back up.  If your symptoms do not get better  within 1 week or if they get worse, check with your doctor. ° °Dulcolax (bisacodyl) - Pick up over-the-counter and take as directed by the product packaging as needed to assist with the movement of your bowels.  Take with a full glass of water.  Use this product as needed if not relieved by Colace only.  ° °MiraLax (polyethylene glycol) - Pick up over-the-counter to have on hand.  MiraLax is a solution that will increase the amount of water in your bowels to assist with bowel movements.  Take as directed and can mix with a glass of water, juice, soda, coffee, or tea.  Take if you go more than two days without a movement. °Do not use MiraLax more than once per day. Call your doctor if you are still constipated or irregular after using this medication for 7 days in a row. ° °If you continue to have problems with postoperative constipation, please contact the office for further assistance and recommendations.  If you experience "the worst abdominal pain ever" or develop nausea or vomiting, please contact the office immediatly for further recommendations for treatment. ° °ITCHING ° If you experience itching with your medications, try taking only a single pain pill, or even half a pain pill at a time.  You can also use Benadryl over the counter for itching or also to help with sleep.  ° °TED HOSE STOCKINGS °Wear the elastic stockings on both legs for three weeks following surgery during the day but you may remove then at night for sleeping. ° °MEDICATIONS °See your medication summary on the “After Visit Summary” that the nursing staff will review with you prior to discharge.  You may have some home medications which will be placed on hold until you complete the course of blood thinner medication.  It is important for you to complete the blood thinner medication as prescribed by your surgeon.  Continue your approved medications as instructed at time of discharge. ° °PRECAUTIONS °If you experience chest pain or shortness  of breath - call 911 immediately for transfer to the hospital emergency department.  °If you develop a fever greater that 101 F, purulent drainage from wound, increased redness or drainage from wound, foul odor from the wound/dressing, or calf pain - CONTACT YOUR SURGEON.   °                                                °FOLLOW-UP APPOINTMENTS °Make sure you keep all of your appointments after your operation with your surgeon and caregivers. You should call the office at the above phone number and make an appointment for approximately two weeks after the date of your surgery or on the date instructed by your surgeon outlined in the "After Visit Summary". ° ° °RANGE OF MOTION AND STRENGTHENING EXERCISES  °Rehabilitation of the knee is important following a knee injury or an operation. After just a few days of immobilization, the muscles   of the thigh which control the knee become weakened and shrink (atrophy). Knee exercises are designed to build up the tone and strength of the thigh muscles and to improve knee motion. Often times heat used for twenty to thirty minutes before working out will loosen up your tissues and help with improving the range of motion but do not use heat for the first two weeks following surgery. These exercises can be done on a training (exercise) mat, on the floor, on a table or on a bed. Use what ever works the best and is most comfortable for you Knee exercises include:  Leg Lifts - While your knee is still immobilized in a splint or cast, you can do straight leg raises. Lift the leg to 60 degrees, hold for 3 sec, and slowly lower the leg. Repeat 10-20 times 2-3 times daily. Perform this exercise against resistance later as your knee gets better.  Quad and Hamstring Sets - Tighten up the muscle on the front of the thigh (Quad) and hold for 5-10 sec. Repeat this 10-20 times hourly. Hamstring sets are done by pushing the foot backward against an object and holding for 5-10 sec. Repeat as  with quad sets.   Leg Slides: Lying on your back, slowly slide your foot toward your buttocks, bending your knee up off the floor (only go as far as is comfortable). Then slowly slide your foot back down until your leg is flat on the floor again.  Angel Wings: Lying on your back spread your legs to the side as far apart as you can without causing discomfort.  A rehabilitation program following serious knee injuries can speed recovery and prevent re-injury in the future due to weakened muscles. Contact your doctor or a physical therapist for more information on knee rehabilitation.   IF YOU ARE TRANSFERRED TO A SKILLED REHAB FACILITY If the patient is transferred to a skilled rehab facility following release from the hospital, a list of the current medications will be sent to the facility for the patient to continue.  When discharged from the skilled rehab facility, please have the facility set up the patient's Home Health Physical Therapy prior to being released. Also, the skilled facility will be responsible for providing the patient with their medications at time of release from the facility to include their pain medication, the muscle relaxants, and their blood thinner medication. If the patient is still at the rehab facility at time of the two week follow up appointment, the skilled rehab facility will also need to assist the patient in arranging follow up appointment in our office and any transportation needs.  MAKE SURE YOU:  Understand these instructions.  Get help right away if you are not doing well or get worse.    Pick up stool softner and laxative for home use following surgery while on pain medications. Do not submerge incision under water. Please use good hand washing techniques while changing dressing each day. May shower starting three days after surgery. Please use a clean towel to pat the incision dry following showers. Continue to use ice for pain and swelling after  surgery. Do not use any lotions or creams on the incision until instructed by your surgeon.   Information on my medicine - XARELTO (Rivaroxaban)  This medication education was reviewed with me or my healthcare representative as part of my discharge preparation.  The pharmacist that spoke with me during my hospital stay was:  Otho BellowsGreen, Terri L, [facility]  Why was Xarelto  prescribed for you? Xarelto was prescribed for you to reduce the risk of blood clots forming after orthopedic surgery. The medical term for these abnormal blood clots is venous thromboembolism (VTE).  What do you need to know about xarelto ? Take your Xarelto ONCE DAILY at the same time every day. You may take it either with or without food.  If you have difficulty swallowing the tablet whole, you may crush it and mix in applesauce just prior to taking your dose.  Take Xarelto exactly as prescribed by your doctor and DO NOT stop taking Xarelto without talking to the doctor who prescribed the medication.  Stopping without other VTE prevention medication to take the place of Xarelto may increase your risk of developing a clot.  After discharge, you should have regular check-up appointments with your healthcare provider that is prescribing your Xarelto.    What do you do if you miss a dose? If you miss a dose, take it as soon as you remember on the same day then continue your regularly scheduled once daily regimen the next day. Do not take two doses of Xarelto on the same day.   Important Safety Information A possible side effect of Xarelto is bleeding. You should call your healthcare provider right away if you experience any of the following: ? Bleeding from an injury or your nose that does not stop. ? Unusual colored urine (red or dark brown) or unusual colored stools (red or black). ? Unusual bruising for unknown reasons. ? A serious fall or if you hit your head (even if there is no bleeding).  Some medicines may  interact with Xarelto and might increase your risk of bleeding while on Xarelto. To help avoid this, consult your healthcare provider or pharmacist prior to using any new prescription or non-prescription medications, including herbals, vitamins, non-steroidal anti-inflammatory drugs (NSAIDs) and supplements. Celexa was discussed, plan to hold till Xarelto completed, use Tylenol for joint pain.   This website has more information on Xarelto: VisitDestination.com.br.

## 2015-03-15 NOTE — Evaluation (Signed)
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  Pt will benefit from skilled PT to increase their independence and safety with mobility to allow discharge to the venue listed below.   Pt assisted with short distance ambulation in hallway however feeling nauseated upon return to room and requested return to supine.  Pt plans to d/c to SNF.     Follow Up Recommendations SNF    Equipment Recommendations  None recommended by PT    Recommendations for Other Services       Precautions / Restrictions Precautions Precautions: Knee Required Braces or Orthoses: Knee Immobilizer - Left Knee Immobilizer - Left: Discontinue once straight leg raise with < 10 degree lag Restrictions Weight Bearing Restrictions: No Other Position/Activity Restrictions: WBAT      Mobility  Bed Mobility Overal bed mobility: Needs Assistance Bed Mobility: Supine to Sit;Sit to Supine     Supine to sit: Min assist;HOB elevated Sit to supine: Min assist   General bed mobility comments: assist for L LE support  Transfers Overall transfer level: Needs assistance Equipment used: Rolling walker (2 wheeled) Transfers: Sit to/from Stand Sit to Stand: Min assist         General transfer comment: verbal cues for UE and LE positioning, assist to rise and control descent  Ambulation/Gait Ambulation/Gait assistance: Min guard Ambulation Distance (Feet): 80 Feet Assistive device: Rolling walker (2 wheeled) Gait Pattern/deviations: Step-to pattern;Antalgic     General Gait Details: verbal cues for sequence, RW positioning, step length, posture  Stairs            Wheelchair Mobility    Modified Rankin (Stroke Patients Only)       Balance  Overall balance assessment: Needs assistance         Standing balance support: Bilateral upper extremity supported;During functional activity Standing balance-Leahy Scale: Poor Standing balance comment: Pt needs walker to balance self in standing.                             Pertinent Vitals/Pain Pain Assessment: 0-10 Pain Score: 5  Pain Location: L knee Pain Descriptors / Indicators: Sore;Aching Pain Intervention(s): Limited activity within patient's tolerance;Monitored during session;Premedicated before session;Ice applied    Home Living Family/patient expects to be discharged to:: Skilled nursing facility Living Arrangements: Spouse/significant other               Additional Comments: [facility]    Prior Function Level of Independence: Independent               Hand Dominance   Dominant Hand: Right    Extremity/Trunk Assessment   Upper Extremity Assessment: Overall WFL for tasks assessed           Lower Extremity Assessment: LLE deficits/detail   LLE Deficits / Details: fair quad contraction, maintained KI  Cervical / Trunk Assessment: Normal  Communication   Communication: HOH  Cognition Arousal/Alertness: Awake/alert Behavior During Therapy: WFL for tasks assessed/performed Overall Cognitive Status: Within Functional Limits for tasks assessed  General Comments General comments (skin integrity, edema, etc.): pt doing well with adls.  Needs assist with LE adls and reaching feet.    Exercises        Assessment/Plan    PT Assessment Patient needs continued PT services  PT Diagnosis Difficulty walking;Acute pain   PT Problem List Decreased strength;Decreased range of motion;Decreased mobility;Decreased knowledge of precautions;Pain;Decreased knowledge of use of DME  PT Treatment Interventions Functional mobility training;Gait training;DME  instruction;Patient/family education;Therapeutic activities;Therapeutic exercise   PT Goals (Current goals can be found in the Care Plan section) Acute Rehab PT Goals Patient Stated Goal: to go to [facility] and then home w/o a walker PT Goal Formulation: With patient Time For Goal Achievement: 03/19/15 Potential to Achieve Goals: Good    Frequency 7X/week   Barriers to discharge        Co-evaluation               End of Session Equipment Utilized During Treatment: Gait belt;Left knee immobilizer Activity Tolerance: Other (comment) (limited by nausea) Patient left: in bed;with call bell/phone within reach           Time: 1013-1030 PT Time Calculation (min) (ACUTE ONLY): 17 min   Charges:   PT Evaluation $Initial PT Evaluation Tier I: 1 Procedure     PT G Codes:        Shalae Belmonte,KATHrine E 03/15/2015, 11:11 AM Zenovia Jarred, PT, DPT 03/15/2015 Pager: [phone]

## 2015-03-15 NOTE — Care Management Note (Signed)
    Page 1 of 1   03/15/2015     4:42:09 PM CARE MANAGEMENT NOTE 03/15/2015  Patient:  Lori Meyers,Lori Meyers   Account Number:  [id number]  Date Initiated:  03/15/2015  Documentation initiated by:  [facility] FoundationJEFFRIES,Maleeah Crossman  Subjective/Objective Assessment:     Action/Plan:   Anticipated DC Date:     Anticipated DC Plan:  SKILLED NURSING FACILITY         Choice offered to / List presented to:             Status of service:  Completed, signed off Medicare Important Message given?   (If response is "NO", the following Medicare IM given date fields will be blank) Date Medicare IM given:   Medicare IM given by:   Date Additional Medicare IM given:   Additional Medicare IM given by:    Discharge Disposition:  SKILLED NURSING FACILITY  Per UR Regulation:    If discussed at Long Length of Stay Meetings, dates discussed:    Comments:  03/15/15 16:40 CM notes pt to go to SNF; CSW arranding.  No other CM needs were communicated.  Freddy JakschSarah Wendell Fiebig, BSN, CM [phone].

## 2015-03-15 NOTE — Progress Notes (Signed)
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  Pt reports plan to possibly d/c to SNF tomorrow.  Precautions  Precautions Knee  Required Braces or Orthoses Knee Immobilizer - Left  Knee Immobilizer - Left Discontinue once straight leg raise with < 10 degree lag  Restrictions  Other Position/Activity Restrictions WBAT  Pain Assessment  Pain Assessment 0-10  Pain Score 6  Pain Location L knee  Pain Descriptors / Indicators Aching;Sore  Pain Intervention(s) Limited activity within patient's tolerance;Monitored during session;Patient requesting pain meds-RN notified;Ice applied  Cognition  Arousal/Alertness Awake/alert  Behavior During Therapy WFL for tasks assessed/performed  Overall Cognitive Status Within Functional Limits for tasks assessed  Bed Mobility  Overal bed mobility Needs Assistance  Bed Mobility Supine to Sit;Sit to Supine  Supine to sit Min assist;HOB elevated  Sit to supine Min assist  General bed mobility comments assist for L LE support  Transfers  Overall transfer level Needs assistance  Equipment used Rolling walker (2 wheeled)  Transfers Sit to/from Stand  Sit to Stand Min assist  General transfer comment verbal cues for UE and LE positioning, assist to rise and control descent  Ambulation/Gait  Ambulation/Gait assistance Min guard  Ambulation Distance (Feet) 80 Feet  Assistive device Rolling walker (2 wheeled)  Gait Pattern/deviations Step-to pattern;Antalgic  General Gait Details verbal cues for sequence, RW positioning, step length, posture  Exercises   Exercises Total Joint  Total Joint Exercises  Ankle Circles/Pumps AROM;10 reps;Both  Quad Sets AROM;Both;10 reps  Short Arc [place] [employer]ad AAROM;Left;10 reps  Heel Slides AAROM;Left;10 reps  Hip ABduction/ADduction AAROM;Left;10 reps  Straight Leg Raises AAROM;Left;10 reps  Goniometric ROM AAROM knee flexion supine approx 30*  PT - End of Session  Equipment Utilized During Treatment Left knee immobilizer  Activity Tolerance Patient tolerated treatment well  Patient left in bed;with call bell/phone within reach  Nurse Communication Patient requests pain meds  PT - Assessment/Plan  PT Plan Current plan remains appropriate  PT Frequency (ACUTE ONLY) 7X/week  Follow Up Recommendations SNF  PT equipment None recommended by PT  PT Goal Progression  Progress towards PT goals Progressing toward goals  PT General Charges  $$ ACUTE PT VISIT 1 Procedure  PT Treatments  $Therapeutic Exercise 8-22 mins   Zenovia JarredKati Sabatino Williard, PT, DPT 03/15/2015 Pager: [phone]

## 2015-03-15 NOTE — Discharge Summary (Signed)
Physician Discharge Summary   Patient ID: Lori Meyers MRN: [id number] DOB/AGE: 1941/03/29 74 y.o.  Admit date: 03/14/2015 Discharge date: 03/17/2015  Primary Diagnosis:  Osteoarthritis Left knee(s)  Admission Diagnoses:  Past Medical History  Diagnosis Date  . Hypercholesteremia     under control  . Hypothyroidism     thyroidectomy-under control  . Arthritis     arms, knees  . Bursitis of left knee    Discharge Diagnoses:   Principal Problem:   OA (osteoarthritis) of knee  Estimated body mass index is 30.83 kg/(m^2) as calculated from the following:   Height as of this encounter: 5' 3" (1.6 m).   Weight as of this encounter: 78.926 kg (174 lb).  Procedure:  Procedure(s) (LRB): TOTAL LEFT KNEE ARTHROPLASTY (Left)   Consults: None  HPI: Lori Meyers is a 74 y.o. year old female with end stage OA of her left knee with progressively worsening pain and dysfunction. She has constant pain, with activity and at rest and significant functional deficits with difficulties even with ADLs. She has had extensive non-op management including analgesics, injections of cortisone and viscosupplements, and home exercise program, but remains in significant pain with significant dysfunction. Radiographs show bone on bone arthritis medial and patellofemoral. She presents now for left Total Knee Arthroplasty.  Laboratory Data: Admission on 03/14/2015  Component Date Value Ref Range Status  . ABO/RH(D) 03/14/2015 O POS   Final  . Antibody Screen 03/14/2015 NEG   Final  . Sample Expiration 03/14/2015 03/17/2015   Final  . WBC 03/15/2015 13.4* 4.0 - 10.5 K/uL Final  . RBC 03/15/2015 4.01  3.87 - 5.11 MIL/uL Final  . Hemoglobin 03/15/2015 12.0  12.0 - 15.0 g/dL Final  . HCT 03/15/2015 36.0  36.0 - 46.0 % Final  . MCV 03/15/2015 89.8  78.0 - 100.0 fL Final  . MCH 03/15/2015 29.9  26.0 - 34.0 pg Final  . MCHC 03/15/2015 33.3  30.0 - 36.0 g/dL Final  . RDW 03/15/2015 13.1  11.5 - 15.5 % Final   . Platelets 03/15/2015 170  150 - 400 K/uL Final  . Sodium 03/15/2015 138  135 - 145 mmol/L Final  . Potassium 03/15/2015 3.7  3.5 - 5.1 mmol/L Final  . Chloride 03/15/2015 107  96 - 112 mmol/L Final  . CO2 03/15/2015 23  19 - 32 mmol/L Final  . Glucose, Bld 03/15/2015 124* 70 - 99 mg/dL Final  . BUN 03/15/2015 15  6 - 23 mg/dL Final  . Creatinine, Ser 03/15/2015 0.57  0.50 - 1.10 mg/dL Final  . Calcium 03/15/2015 8.6  8.4 - 10.5 mg/dL Final  . GFR calc non Af Amer 03/15/2015 90* >90 mL/min Final  . GFR calc Af Amer 03/15/2015 >90  >90 mL/min Final   Comment: (NOTE) The eGFR has been calculated using the CKD EPI equation. This calculation has not been validated in all clinical situations. eGFR's persistently <90 mL/min signify possible Chronic Kidney Disease.   . Anion gap 03/15/2015 8  5 - 15 Final  . WBC 03/16/2015 11.8* 4.0 - 10.5 K/uL Final  . RBC 03/16/2015 3.90  3.87 - 5.11 MIL/uL Final  . Hemoglobin 03/16/2015 11.7* 12.0 - 15.0 g/dL Final  . HCT 03/16/2015 34.9* 36.0 - 46.0 % Final  . MCV 03/16/2015 89.5  78.0 - 100.0 fL Final  . MCH 03/16/2015 30.0  26.0 - 34.0 pg Final  . MCHC 03/16/2015 33.5  30.0 - 36.0 g/dL Final  . RDW 03/16/2015 13.3  11.5 - 15.5 % Final  . Platelets 03/16/2015 164  150 - 400 K/uL Final  . Sodium 03/16/2015 139  135 - 145 mmol/L Final  . Potassium 03/16/2015 3.8  3.5 - 5.1 mmol/L Final  . Chloride 03/16/2015 104  96 - 112 mmol/L Final  . CO2 03/16/2015 28  19 - 32 mmol/L Final  . Glucose, Bld 03/16/2015 131* 70 - 99 mg/dL Final  . BUN 03/16/2015 16  6 - 23 mg/dL Final  . Creatinine, Ser 03/16/2015 0.63  0.50 - 1.10 mg/dL Final  . Calcium 03/16/2015 8.6  8.4 - 10.5 mg/dL Final  . GFR calc non Af Amer 03/16/2015 87* >90 mL/min Final  . GFR calc Af Amer 03/16/2015 >90  >90 mL/min Final   Comment: (NOTE) The eGFR has been calculated using the CKD EPI equation. This calculation has not been validated in all clinical situations. eGFR's persistently  <90 mL/min signify possible Chronic Kidney Disease.   . Anion gap 03/16/2015 7  5 - 15 Final  . WBC 03/17/2015 8.6  4.0 - 10.5 K/uL Final  . RBC 03/17/2015 4.06  3.87 - 5.11 MIL/uL Final  . Hemoglobin 03/17/2015 12.2  12.0 - 15.0 g/dL Final  . HCT 03/17/2015 36.6  36.0 - 46.0 % Final  . MCV 03/17/2015 90.1  78.0 - 100.0 fL Final  . MCH 03/17/2015 30.0  26.0 - 34.0 pg Final  . MCHC 03/17/2015 33.3  30.0 - 36.0 g/dL Final  . RDW 03/17/2015 13.2  11.5 - 15.5 % Final  . Platelets 03/17/2015 158  150 - 400 K/uL Final  Hospital Outpatient Visit on 03/07/2015  Component Date Value Ref Range Status  . aPTT 03/07/2015 29  24 - 37 seconds Final  . WBC 03/07/2015 8.2  4.0 - 10.5 K/uL Final  . RBC 03/07/2015 4.94  3.87 - 5.11 MIL/uL Final  . Hemoglobin 03/07/2015 15.0  12.0 - 15.0 g/dL Final  . HCT 03/07/2015 44.0  36.0 - 46.0 % Final  . MCV 03/07/2015 89.1  78.0 - 100.0 fL Final  . MCH 03/07/2015 30.4  26.0 - 34.0 pg Final  . MCHC 03/07/2015 34.1  30.0 - 36.0 g/dL Final  . RDW 03/07/2015 13.0  11.5 - 15.5 % Final  . Platelets 03/07/2015 183  150 - 400 K/uL Final  . Sodium 03/07/2015 141  135 - 145 mmol/L Final  . Potassium 03/07/2015 4.8  3.5 - 5.1 mmol/L Final  . Chloride 03/07/2015 104  96 - 112 mmol/L Final  . CO2 03/07/2015 29  19 - 32 mmol/L Final  . Glucose, Bld 03/07/2015 106* 70 - 99 mg/dL Final  . BUN 03/07/2015 21  6 - 23 mg/dL Final  . Creatinine, Ser 03/07/2015 0.77  0.50 - 1.10 mg/dL Final  . Calcium 03/07/2015 9.4  8.4 - 10.5 mg/dL Final  . Total Protein 03/07/2015 7.2  6.0 - 8.3 g/dL Final  . Albumin 03/07/2015 4.4  3.5 - 5.2 g/dL Final  . AST 03/07/2015 39* 0 - 37 U/L Final  . ALT 03/07/2015 25  0 - 35 U/L Final  . Alkaline Phosphatase 03/07/2015 74  39 - 117 U/L Final  . Total Bilirubin 03/07/2015 1.0  0.3 - 1.2 mg/dL Final  . GFR calc non Af Amer 03/07/2015 81* >90 mL/min Final  . GFR calc Af Amer 03/07/2015 >90  >90 mL/min Final   Comment: (NOTE) The eGFR has been  calculated using the CKD EPI equation. This calculation has not been validated in  all clinical situations. eGFR's persistently <90 mL/min signify possible Chronic Kidney Disease.   . Anion gap 03/07/2015 8  5 - 15 Final  . Prothrombin Time 03/07/2015 12.5  11.6 - 15.2 seconds Final  . INR 03/07/2015 0.93  0.00 - 1.49 Final  . Color, Urine 03/07/2015 YELLOW  YELLOW Final  . APPearance 03/07/2015 CLEAR  CLEAR Final  . Specific Gravity, Urine 03/07/2015 1.003* 1.005 - 1.030 Final  . pH 03/07/2015 6.5  5.0 - 8.0 Final  . Glucose, UA 03/07/2015 NEGATIVE  NEGATIVE mg/dL Final  . Hgb urine dipstick 03/07/2015 NEGATIVE  NEGATIVE Final  . Bilirubin Urine 03/07/2015 NEGATIVE  NEGATIVE Final  . Ketones, ur 03/07/2015 NEGATIVE  NEGATIVE mg/dL Final  . Protein, ur 03/07/2015 NEGATIVE  NEGATIVE mg/dL Final  . Urobilinogen, UA 03/07/2015 0.2  0.0 - 1.0 mg/dL Final  . Nitrite 03/07/2015 NEGATIVE  NEGATIVE Final  . Leukocytes, UA 03/07/2015 NEGATIVE  NEGATIVE Final   MICROSCOPIC NOT DONE ON URINES WITH NEGATIVE PROTEIN, BLOOD, LEUKOCYTES, NITRITE, OR GLUCOSE <1000 mg/dL.  . MRSA, PCR 03/07/2015 NEGATIVE  NEGATIVE Final  . Staphylococcus aureus 03/07/2015 NEGATIVE  NEGATIVE Final   Comment:        The Xpert SA Assay (FDA approved for NASAL specimens in patients over 74 years of age), is one component of a comprehensive surveillance program.  Test performance has been validated by [facility] for patients greater than or equal to 1 year old. It is not intended to diagnose infection nor to guide or monitor treatment.      X-Rays:No results found.  EKG: Orders placed or performed during the hospital encounter of 05/24/14  . EKG 12-Lead  . EKG 12-Lead  . EKG 12-Lead  . EKG 12-Lead     Hospital Course: Lori Meyers is a 73 y.o. who was admitted to [facility]. They were brought to the operating room on 03/14/2015 and underwent Procedure(s): TOTAL LEFT KNEE ARTHROPLASTY.  Patient  tolerated the procedure well and was later transferred to the recovery room and then to the orthopaedic floor for postoperative care.  They were given PO and IV analgesics for pain control following their surgery.  They were given 24 hours of postoperative antibiotics of      Anti-infectives    Start     Dose/Rate Route Frequency Ordered Stop   03/14/15 1400  ceFAZolin (ANCEF) IVPB 2 g/50 mL premix     2 g 100 mL/hr over 30 Minutes Intravenous Every 6 hours 03/14/15 1047 03/14/15 2110   03/14/15 0624  ceFAZolin (ANCEF) IVPB 2 g/50 mL premix     2 g 100 mL/hr over 30 Minutes Intravenous On call to O.R. 03/14/15 0624 03/14/15 0759     and started on DVT prophylaxis in the form of Xarelto.   PT and OT were ordered for total joint protocol.  Discharge planning consulted to help with postop disposition and equipment needs.  Patient had a decent night on the evening of surgery with very little pain.  She was doing well on the morning of day one.  They started to get up OOB with therapy on day one. Hemovac drain was pulled without difficulty.  Continued to work with therapy into day two.  Dressing was changed on day two and the incision was healing well.  She unfortunately had nausea the evening of day one into day two.  Oral pain medications were changed and monitored for further symptoms.  She continued to work with therapy.    She was seen on day three and feeling better.  Arrangements were made to transfer over to [facility] on day three.  Diet: Cardiac diet Activity:WBAT Follow-up:in 2 weeks Disposition - [facility] Discharged Condition: good       Discharge Instructions    Call MD / Call 911    Complete by:  As directed   If you experience chest pain or shortness of breath, CALL 911 and be transported to the hospital emergency room.  If you develope a fever above 101 F, pus (white drainage) or increased drainage or redness at the wound, or calf pain, call your  surgeon's office.     Change dressing    Complete by:  As directed   Change dressing daily with sterile 4 x 4 inch gauze dressing and apply TED hose. Do not submerge the incision under water.     Constipation Prevention    Complete by:  As directed   Drink plenty of fluids.  Prune juice may be helpful.  You may use a stool softener, such as Colace (over the counter) 100 mg twice a day.  Use MiraLax (over the counter) for constipation as needed.     Diet - low sodium heart healthy    Complete by:  As directed      Discharge instructions    Complete by:  As directed   Pick up stool softner and laxative for home use following surgery while on pain medications. Do not submerge incision under water. Please use good hand washing techniques while changing dressing each day. May shower starting three days after surgery. Please use a clean towel to pat the incision dry following showers. Continue to use ice for pain and swelling after surgery. Do not use any lotions or creams on the incision until instructed by your surgeon.  Take Xarelto for two and a half more weeks, then discontinue Xarelto. Once the patient has completed the blood thinner regimen, then take a Baby 81 mg Aspirin daily for three more weeks.  Postoperative Constipation Protocol  Constipation - defined medically as fewer than three stools per week and severe constipation as less than one stool per week.  One of the most common issues patients have following surgery is constipation.  Even if you have a regular bowel pattern at home, your normal regimen is likely to be disrupted due to multiple reasons following surgery.  Combination of anesthesia, postoperative narcotics, change in appetite and fluid intake all can affect your bowels.  In order to avoid complications following surgery, here are some recommendations in order to help you during your recovery period.  Colace (docusate) - Pick up an over-the-counter form of Colace or  another stool softener and take twice a day as long as you are requiring postoperative pain medications.  Take with a full glass of water daily.  If you experience loose stools or diarrhea, hold the colace until you stool forms back up.  If your symptoms do not get better within 1 week or if they get worse, check with your doctor.  Dulcolax (bisacodyl) - Pick up over-the-counter and take as directed by the product packaging as needed to assist with the movement of your bowels.  Take with a full glass of water.  Use this product as needed if not relieved by Colace only.   MiraLax (polyethylene glycol) - Pick up over-the-counter to have on hand.  MiraLax is a solution that will increase the amount of water in  your bowels to assist with bowel movements.  Take as directed and can mix with a glass of water, juice, soda, coffee, or tea.  Take if you go more than two days without a movement. Do not use MiraLax more than once per day. Call your doctor if you are still constipated or irregular after using this medication for 7 days in a row.  If you continue to have problems with postoperative constipation, please contact the office for further assistance and recommendations.  If you experience "the worst abdominal pain ever" or develop nausea or vomiting, please contact the office immediatly for further recommendations for treatment.'  When discharged from the skilled rehab facility, please have the facility set up the patient's Home Health Physical Therapy prior to being released.  Please make sure this gets set up prior to release in order to avoid any lapse of therapy following the rehab stay.  Also provide the patient with their medications at time of release from the facility to include their pain medication, the muscle relaxants, and their blood thinner medication.  If the patient is still at the rehab facility at time of follow up appointment, please also assist the patient in arranging follow up  appointment in our office and any transportation needs. ICE to the affected knee or hip every three hours for 30 minutes at a time and then as needed for pain and swelling.     Do not put a pillow under the knee. Place it under the heel.    Complete by:  As directed      Do not sit on low chairs, stoools or toilet seats, as it may be difficult to get up from low surfaces    Complete by:  As directed      Driving restrictions    Complete by:  As directed   No driving until released by the physician.     Increase activity slowly as tolerated    Complete by:  As directed      Lifting restrictions    Complete by:  As directed   No lifting until released by the physician.     Patient may shower    Complete by:  As directed   You may shower without a dressing once there is no drainage.  Do not wash over the wound.  If drainage remains, do not shower until drainage stops.     TED hose    Complete by:  As directed   Use stockings (TED hose) for 3 weeks on both leg(s).  You may remove them at night for sleeping.     Weight bearing as tolerated    Complete by:  As directed   Laterality:  left  Extremity:  Lower            Medication List    STOP taking these medications        amoxicillin 500 MG capsule  Commonly known as:  AMOXIL     celecoxib 200 MG capsule  Commonly known as:  CELEBREX     glucosamine-chondroitin 500-400 MG tablet     multivitamin with minerals Tabs tablet     Vitamin D (Ergocalciferol) 50000 UNITS Caps capsule  Commonly known as:  DRISDOL      TAKE these medications        acetaminophen 325 MG tablet  Commonly known as:  TYLENOL  Take 2 tablets (650 mg total) by mouth every 6 (six) hours as needed for mild pain (or Fever >/=   101).     atorvastatin 80 MG tablet  Commonly known as:  LIPITOR  Take 80 mg by mouth every evening.     bisacodyl 10 MG suppository  Commonly known as:  DULCOLAX  Place 1 suppository (10 mg total) rectally daily as needed for  moderate constipation.     DSS 100 MG Caps  Take 100 mg by mouth 2 (two) times daily.     ezetimibe 10 MG tablet  Commonly known as:  ZETIA  Take 10 mg by mouth every morning.     HYDROcodone-acetaminophen 5-325 MG per tablet  Commonly known as:  NORCO/VICODIN  Take 1-2 tablets by mouth every 4 (four) hours as needed for moderate pain.     levothyroxine 50 MCG tablet  Commonly known as:  SYNTHROID, LEVOTHROID  Take 75-100 mcg by mouth daily before breakfast. Takes 2 tablets on Monday Wednesday and Friday and 1 and 1/2 the rest of the week     liothyronine 5 MCG tablet  Commonly known as:  CYTOMEL  Take 5 mcg by mouth 2 (two) times daily.     methocarbamol 500 MG tablet  Commonly known as:  ROBAXIN  Take 1 tablet (500 mg total) by mouth every 6 (six) hours as needed for muscle spasms.     metoCLOPramide 5 MG tablet  Commonly known as:  REGLAN  Take 1-2 tablets (5-10 mg total) by mouth every 8 (eight) hours as needed for nausea (if ondansetron (ZOFRAN) ineffective.).     ondansetron 4 MG tablet  Commonly known as:  ZOFRAN  Take 1 tablet (4 mg total) by mouth every 6 (six) hours as needed for nausea.     OxyCODONE 10 mg T12a 12 hr tablet  Commonly known as:  OXYCONTIN  Take one tablet by mouth every 12 hours for pain. Do not crush     polyethylene glycol packet  Commonly known as:  MIRALAX / GLYCOLAX  Take 17 g by mouth daily as needed for mild constipation.     polyvinyl alcohol 1.4 % ophthalmic solution  Commonly known as:  LIQUIFILM TEARS  Place 1-2 drops into both eyes as needed for dry eyes.     rivaroxaban 10 MG Tabs tablet  Commonly known as:  XARELTO  - Take 1 tablet (10 mg total) by mouth daily with breakfast. Take Xarelto for two and a half more weeks, then discontinue Xarelto.  - Once the patient has completed the blood thinner regimen, then take a Baby 81 mg Aspirin daily for three more weeks     traMADol 50 MG tablet  Commonly known as:  ULTRAM  Take 1-2  tablets (50-100 mg total) by mouth every 6 (six) hours as needed (mild pain).       Follow-up Information    Follow up with Gearlean Alf, MD. Schedule an appointment as soon as possible for a visit on 03/29/2015.   Specialty:  Orthopedic Surgery   Why:  Call office ASAP at [phone] to set up follow up appointment with Dr. Wynelle Link in two weeks.   Contact information:   [address] [place] [address] [phone]       Signed: Arlee Muslim, PA-C Orthopaedic Surgery 03/17/2015, 8:27 AM

## 2015-03-16 LAB — BASIC METABOLIC PANEL
ANION GAP: 7 (ref 5–15)
BUN: 16 mg/dL (ref 6–23)
CHLORIDE: 104 mmol/L (ref 96–112)
CO2: 28 mmol/L (ref 19–32)
Calcium: 8.6 mg/dL (ref 8.4–10.5)
Creatinine, Ser: 0.63 mg/dL (ref 0.50–1.10)
GFR calc Af Amer: >90 mL/min (ref >90)
GFR calc non Af Amer: 87 mL/min — ABNORMAL LOW (ref >90)
Glucose, Bld: 131 mg/dL — ABNORMAL HIGH (ref 70–99)
Potassium: 3.8 mmol/L (ref 3.5–5.1)
SODIUM: 139 mmol/L (ref 135–145)

## 2015-03-16 LAB — CBC
HCT: 34.9 % — ABNORMAL LOW (ref 36.0–46.0)
HEMOGLOBIN: 11.7 g/dL — AB (ref 12.0–15.0)
MCH: 30 pg (ref 26.0–34.0)
MCHC: 33.5 g/dL (ref 30.0–36.0)
MCV: 89.5 fL (ref 78.0–100.0)
PLATELETS: 164 10*3/uL (ref 150–400)
RBC: 3.9 MIL/uL (ref 3.87–5.11)
RDW: 13.3 % (ref 11.5–15.5)
WBC: 11.8 10*3/uL — AB (ref 4.0–10.5)

## 2015-03-16 MED ORDER — HYDROCODONE-ACETAMINOPHEN 5-325 MG PO TABS
1.0000 | ORAL_TABLET | ORAL | Status: DC | PRN
  Administered 2015-03-16 – 2015-03-17 (×3): 1 via ORAL
  Filled 2015-03-16 (×3): qty 1

## 2015-03-16 NOTE — Progress Notes (Signed)
Physical Therapy Treatment Patient Details Name: Lori Meyers MRN: [id number] DOB: 08/20/1941 Today's Date: 03/16/2015    History of Present Illness Pt is a 74 year old female s/p L TKA with hx of R TKA    PT Comments    Pt treatment was limited by pain this morning. Pt able to complete bed mobility with assistance and transfers with min guard. Pt ambulated 160 ft and reported an increase in pain. Exercises were not completed due to this increase in pain.    Follow Up Recommendations  SNF     Equipment Recommendations  None recommended by PT    Recommendations for Other Services       Precautions / Restrictions Precautions Precautions: Knee Required Braces or Orthoses: Knee Immobilizer - Left Knee Immobilizer - Left: Discontinue once straight leg raise with < 10 degree lag Restrictions Weight Bearing Restrictions: No Other Position/Activity Restrictions: WBAT    Mobility  Bed Mobility Overal bed mobility: Needs Assistance Bed Mobility: Supine to Sit;Sit to Supine     Supine to sit: Min assist Sit to supine: Min assist   General bed mobility comments: assist for L LE support  Transfers Overall transfer level: Needs assistance Equipment used: Rolling walker (2 wheeled) Transfers: Sit to/from [employer]Stand;Stand Pivot Transfers Sit to Stand: Min guard Stand pivot transfers: Min guard       General transfer comment: pt was min guard for [facility]  Ambulation/Gait Ambulation/Gait assistance: Min guard Ambulation Distance (Feet): 160 Feet Assistive device: Rolling walker (2 wheeled) Gait Pattern/deviations: Step-to pattern;Antalgic     General Gait Details: no verbal cues needed. pt reports of 6/10 pain during ambulation. min guard due to increased pain    Stairs            Wheelchair Mobility    Modified Rankin (Stroke Patients Only)       Balance                                    Cognition Arousal/Alertness:  Awake/alert Behavior During Therapy: WFL for tasks assessed/performed Overall Cognitive Status: Within Functional Limits for tasks assessed                      Exercises      General Comments        Pertinent Vitals/Pain Pain Assessment: 0-10 Pain Score: 6  Pain Location: L knee Pain Descriptors / Indicators: Aching;Sore Pain Intervention(s): Limited activity within patient's tolerance;Monitored during session;Premedicated before session;Ice applied    Home Living                      Prior Function            PT Goals (current goals can now be found in the care plan section) Acute Rehab PT Goals Patient Stated Goal[facility] then home w/o a walker PT Goal Formulation: With patient Time For Goal Achievement: 03/19/15 Potential to Achieve Goals: Good Progress towards PT goals: Progressing toward goals    Frequency  7X/week    PT Plan Current plan remains appropriate    Co-evaluation             End of Session Equipment Utilized During Treatment: Left knee immobilizer Activity Tolerance: Patient limited by pain Patient left: in bed;with call bell/phone within reach     Time: 0907-0920 PT  Time Calculation (min) (ACUTE ONLY): 13 min  Charges:  $Gait Training: 8-22 mins                    G Codes:      Crystle Carelli 2015-03-29, 12:22 PM Netty Starring, SPT

## 2015-03-16 NOTE — Progress Notes (Signed)
Physical Therapy Treatment Note    03/16/15 1400  PT Visit Information  Last PT Received On 03/16/15  Assistance Needed +1  History of Present Illness Pt is a 74 year old female s/p L TKA with hx of R TKA  Subjective Data  Subjective Pt tolerated treatment well. Pt completed bed mobility with assistance, transfers and ambulation were min guard. Pt still having increased pain during ambulation. Exercises were completed this session. Pt began to feel nauseated towards end of treatment, stated she would contact nurse if it didnt get any better. Pt is progressing well with PT.   Precautions  Precautions Knee  Required Braces or Orthoses Knee Immobilizer - Left  Knee Immobilizer - Left Discontinue once straight leg raise with < 10 degree lag  Restrictions  Weight Bearing Restrictions No  Other Position/Activity Restrictions WBAT  Pain Assessment  Pain Assessment 0-10  Pain Score 6  Pain Location L knee  Pain Descriptors / Indicators Aching;Sore  Pain Intervention(s) Limited activity within patient's tolerance;Monitored during session;Ice applied  Cognition  Arousal/Alertness Awake/alert  Behavior During Therapy WFL for tasks assessed/performed  Overall Cognitive Status Within Functional Limits for tasks assessed  Bed Mobility  Overal bed mobility Needs Assistance  Bed Mobility Supine to Sit;Sit to Supine  Supine to sit Min assist  Sit to supine Min assist  General bed mobility comments assist for L LE support  Transfers  Overall transfer level Needs assistance  Equipment used Rolling walker (2 wheeled)  Transfers Sit to/from Stand  Sit to Stand Min guard  General transfer comment verbal cues given for hand placement and LE placement  Ambulation/Gait  Ambulation/Gait assistance Min guard  Ambulation Distance (Feet) 160 Feet  Assistive device Rolling walker (2 wheeled)  Gait Pattern/deviations Antalgic;Step-to pattern  General Gait Details no verbal cues needed for ambulation, pt  reports of increased pain during ambulation, min guard for safety  Total Joint Exercises  Ankle Circles/Pumps AROM;15 reps;Both  Quad Sets AROM;10 reps;Left  Towel Squeeze AROM;10 reps;Both  Short Arc [employer] Works[employer]ad AAROM;Left;10 reps  Heel Slides AAROM;Left;10 reps  Hip ABduction/ADduction AROM;10 reps;Left  Straight Leg Raises AAROM;Left;10 reps  PT - End of Session  Equipment Utilized During Treatment Left knee immobilizer  Activity Tolerance Patient tolerated treatment well  Patient left in bed;with call bell/phone within reach  Nurse Communication Mobility status  PT - Assessment/Plan  PT Plan Current plan remains appropriate  PT Frequency (ACUTE ONLY) 7X/week  Follow Up Recommendations SNF  PT equipment None recommended by PT  PT Goal Progression  Progress towards PT goals Progressing toward goals  Acute Rehab PT Goals  PT Goal Formulation With patient  Time For Goal Achievement 03/19/15  Potential to Achieve Goals Good  Netty StarringJames Avneet Ashmore, SPT

## 2015-03-16 NOTE — Progress Notes (Signed)
   Subjective: 2 Days Post-Op Procedure(s) (LRB): TOTAL LEFT KNEE ARTHROPLASTY (Left) Patient reports pain as mild and moderate.   Patient seen in rounds for Dr. Lequita HaltAluisio.  Unfortunately, she had nausea all thru the night.  Will change PO pain medication today and see how she does with med change. Patient is well, but has had some minor complaints of nausea and pain in the knee, requiring pain medications Patient not ready at this time for transfer but will see how she does with med change and therapy.  Objective: Vital signs in last 24 hours: Temp:  [97.3 F (36.3 C)-98.9 F (37.2 C)] 98 F (36.7 C) (04/06 0609) Pulse Rate:  [72-86] 86 (04/06 0609) Resp:  [14-18] 16 (04/06 0609) BP: (131-164)/(53-62) 164/62 mmHg (04/06 0609) SpO2:  [95 %-100 %] 96 % (04/06 0609) FiO2 (%):  [2 %] 2 % (04/05 1438)  Intake/Output from previous day:  Intake/Output Summary (Last 24 hours) at 03/16/15 0816 Last data filed at 03/16/15 0611  Gross per 24 hour  Intake 111.17 ml  Output   2575 ml  Net -2463.83 ml    Intake/Output this shift:    Labs:  Recent Labs  03/15/15 0525 03/16/15 0446  HGB 12.0 11.7*    Recent Labs  03/15/15 0525 03/16/15 0446  WBC 13.4* 11.8*  RBC 4.01 3.90  HCT 36.0 34.9*  PLT 170 164    Recent Labs  03/15/15 0525 03/16/15 0446  NA 138 139  K 3.7 3.8  CL 107 104  CO2 23 28  BUN 15 16  CREATININE 0.57 0.63  GLUCOSE 124* 131*  CALCIUM 8.6 8.6   No results for input(s): LABPT, INR in the last 72 hours.  EXAM: General - Patient is Alert, Appropriate and Oriented Extremity - Neurovascular intact Sensation intact distally Dorsiflexion/Plantar flexion intact Incision - clean, dry, no drainage Motor Function - intact, moving foot and toes well on exam.   Assessment/Plan: 2 Days Post-Op Procedure(s) (LRB): TOTAL LEFT KNEE ARTHROPLASTY (Left) Procedure(s) (LRB): TOTAL LEFT KNEE ARTHROPLASTY (Left) Past Medical History  Diagnosis Date  .  Hypercholesteremia     under control  . Hypothyroidism     thyroidectomy-under control  . Arthritis     arms, knees  . Bursitis of left knee    Principal Problem:   OA (osteoarthritis) of knee  Estimated body mass index is 30.83 kg/(m^2) as calculated from the following:   Height as of this encounter: 5\' 3"  (1.6 m).   Weight as of this encounter: 78.926 kg (174 lb). Up with therapy Discharge to SNF when improved Diet - Cardiac diet Follow up - in 2 weeks Activity - WBAT Disposition - Skilled nursing facility - [facility] Condition Upon Discharge - Pending at this time D/C Meds - See DC Summary DVT Prophylaxis - Xarelto  Avel Peacerew Pernella Ackerley, PA-C Orthopaedic Surgery 03/16/2015, 8:16 AM

## 2015-03-17 LAB — CBC
HEMATOCRIT: 36.6 % (ref 36.0–46.0)
Hemoglobin: 12.2 g/dL (ref 12.0–15.0)
MCH: 30 pg (ref 26.0–34.0)
MCHC: 33.3 g/dL (ref 30.0–36.0)
MCV: 90.1 fL (ref 78.0–100.0)
Platelets: 158 10*3/uL (ref 150–400)
RBC: 4.06 MIL/uL (ref 3.87–5.11)
RDW: 13.2 % (ref 11.5–15.5)
WBC: 8.6 10*3/uL (ref 4.0–10.5)

## 2015-03-17 MED ORDER — HYDROCODONE-ACETAMINOPHEN 5-325 MG PO TABS: 1.0000 | ORAL_TABLET | ORAL | Status: DC | PRN

## 2015-03-17 MED ORDER — OXYCODONE HCL ER 10 MG PO T12A: EXTENDED_RELEASE_TABLET | ORAL | Status: DC

## 2015-03-17 MED ORDER — METHOCARBAMOL 500 MG PO TABS: 500.0000 mg | ORAL_TABLET | Freq: Four times a day (QID) | ORAL | Status: DC | PRN

## 2015-03-17 MED ORDER — TRAMADOL HCL 50 MG PO TABS: 50.0000 mg | ORAL_TABLET | Freq: Four times a day (QID) | ORAL | Status: DC | PRN

## 2015-03-17 MED ORDER — RIVAROXABAN 10 MG PO TABS: 10.0000 mg | ORAL_TABLET | Freq: Every day | ORAL | Status: DC

## 2015-03-17 NOTE — Progress Notes (Signed)
Patient was stable at discharge. [facility]. No answer at either phone numbers.

## 2015-03-17 NOTE — Progress Notes (Signed)
   Subjective: 3 Days Post-Op Procedure(s) (LRB): TOTAL LEFT KNEE ARTHROPLASTY (Left) Patient reports pain as mild.   Patient seen in rounds by Dr. Lequita HaltAluisio. Patient is well, but has had some minor complaints of pain in the knee, requiring pain medications Patient is ready to go home today following therapy  Objective: Vital signs in last 24 hours: Temp:  [98.3 F (36.8 C)-98.9 F (37.2 C)] 98.4 F (36.9 C) (04/07 0618) Pulse Rate:  [82-97] 89 (04/07 0618) Resp:  [16] 16 (04/07 0618) BP: (121-147)/(51-89) 135/64 mmHg (04/07 0618) SpO2:  [95 %-98 %] 95 % (04/07 0618)  Intake/Output from previous day:  Intake/Output Summary (Last 24 hours) at 03/17/15 0739 Last data filed at 03/16/15 1851  Gross per 24 hour  Intake    720 ml  Output    850 ml  Net   -130 ml     Labs:  Recent Labs  03/15/15 0525 03/16/15 0446 03/17/15 0505  HGB 12.0 11.7* 12.2    Recent Labs  03/16/15 0446 03/17/15 0505  WBC 11.8* 8.6  RBC 3.90 4.06  HCT 34.9* 36.6  PLT 164 158    Recent Labs  03/15/15 0525 03/16/15 0446  NA 138 139  K 3.7 3.8  CL 107 104  CO2 23 28  BUN 15 16  CREATININE 0.57 0.63  GLUCOSE 124* 131*  CALCIUM 8.6 8.6   No results for input(s): LABPT, INR in the last 72 hours.  EXAM: General - Patient is Alert, Appropriate and Oriented Extremity - Neurovascular intact Sensation intact distally Dorsiflexion/Plantar flexion intact Incision - clean, dry, no drainage Motor Function - intact, moving foot and toes well on exam.   Assessment/Plan: 3 Days Post-Op Procedure(s) (LRB): TOTAL LEFT KNEE ARTHROPLASTY (Left) Procedure(s) (LRB): TOTAL LEFT KNEE ARTHROPLASTY (Left) Past Medical History  Diagnosis Date  . Hypercholesteremia     under control  . Hypothyroidism     thyroidectomy-under control  . Arthritis     arms, knees  . Bursitis of left knee    Principal Problem:   OA (osteoarthritis) of knee  Estimated body mass index is 30.83 kg/(m^2) as  calculated from the following:   Height as of this encounter: 5\' 3"  (1.6 m).   Weight as of this encounter: 78.926 kg (174 lb). Up with therapy Discharge home with home health Diet - Cardiac diet Follow up - in 2 weeks Activity - WBAT Disposition - Home Condition Upon Discharge - Good D/C Meds - See DC Summary DVT Prophylaxis - Xarelto  Avel Peacerew Perkins, PA-C Orthopaedic Surgery 03/17/2015, 7:39 AM

## 2015-03-17 NOTE — Progress Notes (Signed)
Physical Therapy Treatment Patient Details Name: Lori Meyers MRN: [id number] DOB: 06/13/1941 Today's Date: 03/17/2015    History of Present Illness Pt is a 74 year old female s/p L TKA with hx of R TKA    PT Comments    Progressing with mobility. Pt awaiting d/c to SNF  Follow Up Recommendations  SNF     Equipment Recommendations  None recommended by PT    Recommendations for Other Services       Precautions / Restrictions Precautions Precautions: Knee Required Braces or Orthoses: Knee Immobilizer - Left Knee Immobilizer - Left: Discontinue once straight leg raise with < 10 degree lag Restrictions Weight Bearing Restrictions: No Other Position/Activity Restrictions: WBAT    Mobility  Bed Mobility Overal bed mobility: Needs Assistance Bed Mobility: Supine to Sit;Sit to Supine     Supine to sit: Min assist Sit to supine: Min assist   General bed mobility comments: assist for L LE support  Transfers Overall transfer level: Needs assistance Equipment used: Rolling walker (2 wheeled) Transfers: Sit to/from Stand Sit to Stand: Min guard Stand pivot transfers: Min guard       General transfer comment: verbal cues given for hand placement and LE placement  Ambulation/Gait Ambulation/Gait assistance: Min guard Ambulation Distance (Feet): 75 Feet Assistive device: Rolling walker (2 wheeled) Gait Pattern/deviations: Step-to pattern;Antalgic     General Gait Details: close guard for safety. Pacing a little quick initially but it normalized as distance progressed.    Stairs            Wheelchair Mobility    Modified Rankin (Stroke Patients Only)       Balance                                    Cognition Arousal/Alertness: Awake/alert Behavior During Therapy: WFL for tasks assessed/performed Overall Cognitive Status: Within Functional Limits for tasks assessed                      Exercises Total Joint Exercises Ankle  Circles/Pumps: AROM;Both;10 reps;Supine Quad Sets: AROM;Both;10 reps;Supine Heel Slides: AAROM;Left;10 reps;Supine Hip ABduction/ADduction: Left;AROM;10 reps;Supine Straight Leg Raises: AAROM;Left;10 reps;Supine Goniometric ROM: ~10-35 degrees    General Comments        Pertinent Vitals/Pain Pain Assessment: 0-10 Pain Score: 5  Pain Location: L knee with activity Pain Descriptors / Indicators: Aching;Sore Pain Intervention(s): Monitored during session;Ice applied;Repositioned    Home Living                      Prior Function            PT Goals (current goals can now be found in the care plan section) Progress towards PT goals: Progressing toward goals    Frequency  7X/week    PT Plan Current plan remains appropriate    Co-evaluation             End of Session Equipment Utilized During Treatment: Gait belt;Left knee immobilizer Activity Tolerance: Patient tolerated treatment well Patient left: in bed;with call bell/phone within reach     Time: 1022-1046 PT Time Calculation (min) (ACUTE ONLY): 24 min  Charges:  $Gait Training: 8-22 mins $Therapeutic Exercise: 8-22 mins                    G Codes:      Lori Meyers, MPT Pager: [phone]

## 2015-03-17 NOTE — Progress Notes (Signed)
Clinical Social Work Department CLINICAL SOCIAL WORK PLACEMENT NOTE 03/17/2015  Patient:  Lori Meyers,Lori Meyers  Account Number:  [id number] Admit date:  03/14/2015  Clinical Social Worker:  Cori RazorJAMIE Jaheim Canino, LCSW  Date/time:  03/14/2015 01:22 PM  Clinical Social Work is seeking post-discharge placement for this patient at the following level of care:   SKILLED NURSING   (*CSW will update this form in Epic as items are completed)     Patient/family provided with [facility] of Clinical Social Work's list of facilities offering this level of care within the geographic area requested by the patient (or if unable, by the patient's family).  03/14/2015  Patient/family informed of their freedom to choose among providers that offer the needed level of care, that participate in Medicare, Medicaid or managed care program needed by the patient, have an available bed and are willing to accept the patient.  03/14/2015  Patient/family informed of [facility]' ownership interest in [facility], as well as of the fact that they are under no obligation to receive care at this facility.  PASARR submitted to EDS on 03/14/2015 PASARR number received on 03/14/2015  FL2 transmitted to all facilities in geographic area requested by pt/family on  03/14/2015 FL2 transmitted to all facilities within larger geographic area on   Patient informed that his/her managed care company has contracts with or will negotiate with  certain facilities, including the following:     Patient/family informed of bed offers received:  03[facility]14/2015 [facility] recommends and patient chooses bed at    [facility]  03/[facility]/2015 Patient to be transferred to facility by  Patient and family notified of transfer on 03/17/2015 Name of family member notified:  SPOUSE  The following physician request were entered in Epic:   Additional Comments: Pt /  spouse are in agreement with d/c to SNF today. [facility] transport is needed. Pt is aware out of pocket costs may be associated with [facility] transport. NSG has reviewed d/c summary, scripts, avs. Scripts have been included in d/c packet.  Cori RazorJamie Millisa Giarrusso LCSW [phone]

## 2015-03-18 ENCOUNTER — Other Ambulatory Visit: Payer: Self-pay | Admitting: *Deleted

## 2015-03-18 ENCOUNTER — Encounter: Payer: Self-pay | Admitting: Adult Health

## 2015-03-18 ENCOUNTER — Non-Acute Institutional Stay (SKILLED_NURSING_FACILITY): Payer: Medicare Other | Admitting: Adult Health

## 2015-03-18 DIAGNOSIS — E039 Hypothyroidism, unspecified: Secondary | ICD-10-CM

## 2015-03-18 DIAGNOSIS — K5900 Constipation, unspecified: Secondary | ICD-10-CM | POA: Diagnosis not present

## 2015-03-18 DIAGNOSIS — M1712 Unilateral primary osteoarthritis, left knee: Secondary | ICD-10-CM

## 2015-03-18 DIAGNOSIS — E785 Hyperlipidemia, unspecified: Secondary | ICD-10-CM | POA: Diagnosis not present

## 2015-03-18 MED ORDER — OXYCODONE HCL ER 10 MG PO T12A: EXTENDED_RELEASE_TABLET | ORAL | Status: DC

## 2015-03-18 NOTE — Telephone Encounter (Signed)
[facility] 

## 2015-03-18 NOTE — Progress Notes (Signed)
Patient ID: Ned Card, female   DOB: [birth year], 74 y.o.   MRN: [id number]   03/18/2015  Facility:  Nursing Home Location:  [facility] Room Number: 401-P LEVEL OF CARE:  SNF (31)    Chief Complaint  Patient presents with  . Hospitalization Follow-up    Osteoarthritis S/P left total knee arthroplasty, hypothyroidism, hyperlipidemia and constipation    HISTORY OF PRESENT ILLNESS:  This is a 74 year old female who has been admitted to [facility] on 03/17/15 from [facility] with osteoarthritis S/P Left total knee arthroplasty. She has PMH of hyperlipidemia, hypothyroidism, arthritis and bursitis of left knee. She has been admitted for a short-term rehabilitation.  PAST MEDICAL HISTORY:  Past Medical History  Diagnosis Date  . Hypercholesteremia     under control  . Hypothyroidism     thyroidectomy-under control  . Arthritis     arms, knees  . Bursitis of left knee     CURRENT MEDICATIONS: Reviewed per MAR/see medication list  Allergies  Allergen Reactions  . Niacin And Related     Passed out    REVIEW OF SYSTEMS:  GENERAL: no change in appetite, no fatigue, no weight changes, no fever, chills or weakness RESPIRATORY: no cough, SOB, DOE, wheezing, hemoptysis CARDIAC: no chest pain, edema or palpitations GI: no abdominal pain, diarrhea, constipation, heart burn, nausea or vomiting  PHYSICAL EXAMINATION  GENERAL: no acute distress, normal body habitus SKIN:  Left knee surgical site is dry, no redness EYES: conjunctivae normal, sclerae normal, normal eye lids NECK: supple, trachea midline, no neck masses, no thyroid tenderness, no thyromegaly LYMPHATICS: no LAN in the neck, no supraclavicular LAN RESPIRATORY: breathing is even & unlabored, BS CTAB CARDIAC: RRR, no murmur,no extra heart sounds, no edema GI: abdomen soft, normal BS, no masses, no tenderness, no hepatomegaly, no splenomegaly EXTREMITIES:  Able to move 4  extremities PSYCHIATRIC: the patient is alert & oriented to person, affect & behavior appropriate  LABS/RADIOLOGY: Labs reviewed: Basic Metabolic Panel:  Recent Labs  11/91/47 1441 03/15/15 0525 03/16/15 0446  NA 141 138 139  K 4.8 3.7 3.8  CL 104 107 104  CO2 GLUCOSE 106* 124* 131*  BUN CREATININE 0.77 0.57 0.63  CALCIUM 9.4 8.6 8.6   Liver Function Tests:  Recent Labs  05/17/14 0920 03/07/15 1441  AST 21 39*  ALT 26 25  ALKPHOS 76 74  BILITOT 0.8 1.0  PROT 7.4 7.2  ALBUMIN 4.2 4.4   CBC:  Recent Labs  03/15/15 0525 03/16/15 0446 03/17/15 0505  WBC 13.4* 11.8* 8.6  HGB 12.0 11.7* 12.2  HCT 36.0 34.9* 36.6  MCV 89.8 89.5 90.1  PLT 170 164 158    ASSESSMENT/PLAN:  Osteoarthritis S/P left total knee arthroplasty - for rehabilitation; continue Xarelto 10 mg by mouth daily 2 and half weeks then aspirin 81 mg by mouth daily for DVT prophylaxis; OxyContin 10 mg by mouth every 12 hour, tramadol 50 mg 1-2 tabs by mouth every 6 hours when necessary and Norco 5/325 1-2 tabs by mouth every 4 hours when necessary for pain; and Robaxin 500 mg by mouth every 6 hours when necessary for muscle spasm Hypothyroidism - continue Synthroid 75 g by mouth daily on Tuesdays, Thursdays, Saturdays and Sundays, Synthroid 50 g take 2 tabs = 100 g by mouth daily on Mondays, Wednesdays and Fridays and Cytomel 5 g by mouth twice a day Constipation -  continue MiraLAX 17 g by mouth daily and Colace 100 mg by mouth twice a day Hyperlipidemia - continue Lipitor 80 mg by mouth daily at bedtime  Goals of care:  Short-term rehabilitation  Labs/test ordered:  CBC, BMP and tsh   Spent 50 minutes in patient care.     [facility] HospitalMEDINA-VARGAS,Imad Shostak, NP [facility] [phone]

## 2015-03-22 ENCOUNTER — Non-Acute Institutional Stay (SKILLED_NURSING_FACILITY): Payer: Medicare Other | Admitting: Internal Medicine

## 2015-03-22 ENCOUNTER — Encounter: Payer: Self-pay | Admitting: Internal Medicine

## 2015-03-22 DIAGNOSIS — E039 Hypothyroidism, unspecified: Secondary | ICD-10-CM | POA: Diagnosis not present

## 2015-03-22 DIAGNOSIS — E7800 Pure hypercholesterolemia, unspecified: Secondary | ICD-10-CM

## 2015-03-22 DIAGNOSIS — M1712 Unilateral primary osteoarthritis, left knee: Secondary | ICD-10-CM | POA: Diagnosis not present

## 2015-03-22 DIAGNOSIS — K5900 Constipation, unspecified: Secondary | ICD-10-CM | POA: Diagnosis not present

## 2015-03-22 NOTE — Progress Notes (Signed)
Patient ID: Lori Meyers, female   DOB: Dec 02, 1941, 74 y.o.   MRN: [id number]     [facility]   PCP: Ezequiel Kayser, MD  Code Status: full code  Allergies  Allergen Reactions  . Niacin And Related     Passed out    Chief Complaint  Patient presents with  . New Admit To SNF     HPI:  74 year old patient is here for short term rehabilitation post hospital admission from 03/14/15-03/17/15 with left knee OA. She underwent left total knee arthroplasty. She has PMH of hypothyroidism, arthritis and HLD. She is seen in her room today. She mentions that her pain is under control. She was having nausea and not feeling good until yesterday with constipation. Had a good bowel movement last night and feels she is now making improvement.   Review of Systems:  Constitutional: Negative for fever, chills, diaphoresis.  Respiratory: Negative for cough, shortness of breath and wheezing.   Cardiovascular: Negative for chest pain, palpitations, leg swelling.  Gastrointestinal: Negative for heartburn, nausea, vomiting, abdominal pain. Appetite is picking up Genitourinary: Negative for dysuria, urgency Musculoskeletal: Negative for back pain, falls Skin: Negative for itching, rash.  Neurological: Negative for dizziness, tingling, focal weakness Psychiatric/Behavioral: Negative for depression, anxiety, insomnia and memory loss.    Past Medical History  Diagnosis Date  . Hypercholesteremia     under control  . Hypothyroidism     thyroidectomy-under control  . Arthritis     arms, knees  . Bursitis of left knee    Past Surgical History  Procedure Laterality Date  . Thyroidectomy  1990    "zapped"  . Finger surgery  ~2005    joint replaced on right hand middle finger  . 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 Tonsillectomy  as child  . Abdominal hysterectomy  1995    with bladder tac  . Appendectomy  5th grade  . Meniscus repair Left  2007    at office  . 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 Bilateral salpingoophorectomy    . Joint replacement    . Total knee arthroplasty Left 03/14/2015    Procedure: TOTAL LEFT KNEE ARTHROPLASTY;  Surgeon: Ollen Gross, MD;  Location: [facility];  Service: Orthopedics;  Laterality: Left;   Social History:   reports that she has never smoked. She has never used smokeless tobacco. She reports that she does not drink alcohol or use illicit drugs.  No family history on file.  Medications: Patient's Medications  New Prescriptions   No medications on file  Previous Medications   ACETAMINOPHEN (TYLENOL) 325 MG TABLET    Take 2 tablets (650 mg total) by mouth every 6 (six) hours as needed for mild pain (or Fever >/= 101).   ATORVASTATIN (LIPITOR) 80 MG TABLET    Take 80 mg by mouth every evening.    BISACODYL (DULCOLAX) 10 MG SUPPOSITORY    Place 1 suppository (10 mg total) rectally daily as needed for moderate constipation.   DOCUSATE SODIUM 100 MG CAPS    Take 100 mg by mouth 2 (two) times daily.   EZETIMIBE (ZETIA) 10 MG TABLET    Take 10 mg by mouth every morning.   HYDROCODONE-ACETAMINOPHEN (NORCO/VICODIN) 5-325 MG PER TABLET    Take 1-2  tablets by mouth every 4 (four) hours as needed for moderate pain.   LEVOTHYROXINE (SYNTHROID, LEVOTHROID) 50 MCG TABLET    Take 75-100 mcg by mouth daily before breakfast. Takes 2 tablets on Monday Wednesday and Friday and 1 and 1/2 the rest of the week   LIOTHYRONINE (CYTOMEL) 5 MCG TABLET    Take 5 mcg by mouth 2 (two) times daily.   METHOCARBAMOL (ROBAXIN) 500 MG TABLET    Take 1 tablet (500 mg total) by mouth every 6 (six) hours as needed for muscle spasms.   METOCLOPRAMIDE (REGLAN) 5 MG TABLET    Take 1-2 tablets (5-10 mg total) by mouth every 8 (eight) hours as needed for nausea (if ondansetron (ZOFRAN)  ineffective.).   ONDANSETRON (ZOFRAN) 4 MG TABLET    Take 1 tablet (4 mg total) by mouth every 6 (six) hours as needed for nausea.   OXYCODONE (OXYCONTIN) 10 MG T12A 12 HR TABLET    Take one tablet by mouth every 12 hours for pain. Do not crush   POLYETHYLENE GLYCOL (MIRALAX / GLYCOLAX) PACKET    Take 17 g by mouth daily as needed for mild constipation.   POLYVINYL ALCOHOL (LIQUIFILM TEARS) 1.4 % OPHTHALMIC SOLUTION    Place 1-2 drops into both eyes as needed for dry eyes.   RIVAROXABAN (XARELTO) 10 MG TABS TABLET    Take 1 tablet (10 mg total) by mouth daily with breakfast. Take Xarelto for two and a half more weeks, then discontinue Xarelto. Once the patient has completed the blood thinner regimen, then take a Baby 81 mg Aspirin daily for three more weeks   TRAMADOL (ULTRAM) 50 MG TABLET    Take 1-2 tablets (50-100 mg total) by mouth every 6 (six) hours as needed (mild pain).  Modified Medications   No medications on file  Discontinued Medications   No medications on file     Physical Exam: Filed Vitals:   03/22/15 1122  BP: 125/77  Pulse: 86  Temp: 97.4 F (36.3 C)  Resp: 18  Weight: 175 lb (79.379 kg)  SpO2: 97%    General- elderly female, obese, in no acute distress Head- normocephalic, atraumatic Throat- moist mucus membrane Neck- no cervical lymphadenopathy Cardiovascular- normal s1,s2, no murmurs, palpable dorsalis pedis and radial pulses, trace leg edema Respiratory- bilateral clear to auscultation, no wheeze, no rhonchi, no crackles, no use of accessory muscles Abdomen- bowel sounds present, soft, non tender Musculoskeletal- able to move all 4 extremities, left knee ROM limited, ted hose in both legs Neurological- no focal deficit Skin- warm and dry, dry and clean dressing to right knee, steri strips in place with peri- incisional erythema present Psychiatry- alert and oriented to person, place and time, normal mood and affect    Labs reviewed: Basic Metabolic  Panel:  Recent Labs  03/07/15 1441 03/15/15 0525 03/16/15 0446  NA 141 138 139  K 4.8 3.7 3.8  CL 104 107 104  CO2 29 23 28   GLUCOSE 106* 124* 131*  BUN 21 15 16   CREATININE 0.77 0.57 0.63  CALCIUM 9.4 8.6 8.6   Liver Function Tests:  Recent Labs  05/17/14 0920 03/07/15 1441  AST 21 39*  ALT 26 25  ALKPHOS 76 74  BILITOT 0.8 1.0  PROT 7.4 7.2  ALBUMIN 4.2 4.4   No results for input(s): LIPASE, AMYLASE in the last 8760 hours. No results for input(s): AMMONIA in the last 8760 hours. CBC:  Recent Labs  03/15/15 0525 03/16/15 0446 03/17/15 0505  WBC 13.4* 11.8* 8.6  HGB 12.0 11.7* 12.2  HCT 36.0 34.9* 36.6  MCV 89.8 89.5 90.1  PLT 170 164 158    Assessment/Plan  Left knee Osteoarthritis  S/P left total knee arthroplasty. Will have her work with physical therapy and occupational therapy team to help with gait training and muscle strengthening exercises.fall precautions. Skin care. Encourage to be out of bed. Continue Xarelto 10 mg daily for now for dvt prophylaxis. Continue OxyContin 10 mg bid with tramadol 50 mg 1-2 tabs q6h prn and Norco 5/325 1-2 tabs q4h prn for pain. Continue Robaxin 500 mg q6h prn for muscle spasm. Has follow up with orthopedics.  Constipation Stable now, continue miralax and colace. Hydration encouraged  Hypothyroidism continue Synthroid 75-100 mcg home regimen and cytomel 5 mcg bid  Hyperlipidemia continue Lipitor 80 mg daily    Goals of care: short term rehabilitation   Labs/tests ordered: none  Family/ staff Communication: reviewed care plan with patient and nursing supervisor    Oneal Grout, MD  [facility] [phone] (Monday-Friday 8 am - 5 pm) [phone] (afterhours)

## 2015-03-30 ENCOUNTER — Encounter: Payer: Self-pay | Admitting: Adult Health

## 2015-03-30 ENCOUNTER — Non-Acute Institutional Stay (SKILLED_NURSING_FACILITY): Payer: Medicare Other | Admitting: Adult Health

## 2015-03-30 DIAGNOSIS — M1712 Unilateral primary osteoarthritis, left knee: Secondary | ICD-10-CM

## 2015-03-30 DIAGNOSIS — E785 Hyperlipidemia, unspecified: Secondary | ICD-10-CM | POA: Diagnosis not present

## 2015-03-30 DIAGNOSIS — E039 Hypothyroidism, unspecified: Secondary | ICD-10-CM | POA: Diagnosis not present

## 2015-03-30 DIAGNOSIS — K5900 Constipation, unspecified: Secondary | ICD-10-CM | POA: Diagnosis not present

## 2015-03-30 NOTE — Progress Notes (Signed)
Patient ID: Lori Meyers, female   DOB: 05/13/1941, 74 y.o.   MRN: [id number]   03/30/2015  Facility:  Nursing Home Location:  [facility] Room Number: 401-P LEVEL OF CARE:  SNF (31)    Chief Complaint  Patient presents with  . Discharge Note    Osteoarthritis S/P left total knee arthroplasty, hypothyroidism, hyperlipidemia and constipation    HISTORY OF PRESENT ILLNESS:  This is a 74 year old female who is for discharge home and will have outpatient rehabilitation.  She [facility] with osteoarthritis S/P Left total knee arthroplasty. She has PMH of hyperlipidemia, hypothyroidism, arthritis and bursitis of left knee.   Patient was admitted to this facility for short-term rehabilitation after the patient's recent hospitalization.  Patient has completed SNF rehabilitation and therapy has cleared the patient for discharge.   PAST MEDICAL HISTORY:  Past Medical History  Diagnosis Date  . Hypercholesteremia     under control  . Hypothyroidism     thyroidectomy-under control  . Arthritis     arms, knees  . Bursitis of left knee     CURRENT MEDICATIONS: Reviewed per MAR/see medication list  Allergies  Allergen Reactions  . Niacin And Related     Passed out    REVIEW OF SYSTEMS:  GENERAL: no change in appetite, no fatigue, no weight changes, no fever, chills or weakness RESPIRATORY: no cough, SOB, DOE, wheezing, hemoptysis CARDIAC: no chest pain, edema or palpitations GI: no abdominal pain, diarrhea, constipation, heart burn, nausea or vomiting  PHYSICAL EXAMINATION  GENERAL: no acute distress, normal body habitus SKIN:  Left knee surgical site is dry, no redness NECK: supple, trachea midline, no neck masses, no thyroid tenderness, no thyromegaly LYMPHATICS: no LAN in the neck, no supraclavicular LAN RESPIRATORY: breathing is even & unlabored, BS CTAB CARDIAC: RRR, no murmur,no  extra heart sounds, no edema GI: abdomen soft, normal BS, no masses, no tenderness, no hepatomegaly, no splenomegaly EXTREMITIES:  Able to move 4 extremities PSYCHIATRIC: the patient is alert & oriented to person, affect & behavior appropriate  LABS/RADIOLOGY: Labs reviewed: Basic Metabolic Panel:  Recent Labs  02/77/4103/28/16 1441 03/15/15 0525 03/16/15 0446  NA 141 138 139  K 4.8 3.7 3.8  CL 104 107 104  CO2 29 23 28   GLUCOSE 106* 124* 131*  BUN 21 15 16   CREATININE 0.77 0.57 0.63  CALCIUM 9.4 8.6 8.6   Liver Function Tests:  Recent Labs  05/17/14 0920 03/07/15 1441  AST 21 39*  ALT 26 25  ALKPHOS 76 74  BILITOT 0.8 1.0  PROT 7.4 7.2  ALBUMIN 4.2 4.4   CBC:  Recent Labs  03/15/15 0525 03/16/15 0446 03/17/15 0505  WBC 13.4* 11.8* 8.6  HGB 12.0 11.7* 12.2  HCT 36.0 34.9* 36.6  MCV 89.8 89.5 90.1  PLT 170 164 158    ASSESSMENT/PLAN:  Osteoarthritis S/P left total knee arthroplasty - for outpatient rehabilitation; continue Xarelto 10 mg by mouth daily 4 more days then aspirin 81 mg by mouth daily for DVT prophylaxis; OxyContin 10 mg by mouth every 12 hour, tramadol 50 mg 1-2 tabs by mouth every 6 hours when necessary and Norco 5/325 1-2 tabs by mouth every 4 hours when necessary for pain; and Robaxin 500 mg by mouth every 6 hours when necessary for muscle spasm Hypothyroidism - continue Synthroid 75 g by mouth daily on Tuesdays, Thursdays, Saturdays and Sundays, Synthroid 50  g take 2 tabs = 100 g by mouth daily on Mondays, Wednesdays and Fridays and Cytomel 5 g by mouth twice a day Constipation - continue MiraLAX 17 g by mouth daily and Colace 100 mg by mouth twice a day Hyperlipidemia - continue Lipitor 80 mg by mouth daily at bedtime    I have filled out patient's discharge paperwork and written prescriptions.  Patient will have outpatient rehabilitation.  Total discharge time: Less than 30 minutes  Discharge time involved coordination of the discharge  process with Child psychotherapist, nursing staff and therapy department.     [facility], NP [employer] [phone]

## 2015-04-04 ENCOUNTER — Ambulatory Visit: Payer: Medicare Other | Attending: Orthopedic Surgery | Admitting: Physical Therapy

## 2015-04-04 DIAGNOSIS — M542 Cervicalgia: Secondary | ICD-10-CM | POA: Diagnosis present

## 2015-04-04 DIAGNOSIS — M5032 Other cervical disc degeneration, mid-cervical region: Secondary | ICD-10-CM | POA: Insufficient documentation

## 2015-04-04 DIAGNOSIS — M25662 Stiffness of left knee, not elsewhere classified: Secondary | ICD-10-CM

## 2015-04-04 DIAGNOSIS — R29898 Other symptoms and signs involving the musculoskeletal system: Secondary | ICD-10-CM

## 2015-04-04 DIAGNOSIS — M25562 Pain in left knee: Secondary | ICD-10-CM

## 2015-04-04 NOTE — Therapy (Signed)
[facility] [address] [place], [place], [address] Phone: [phone]   Fax:  [phone]  Physical Therapy Evaluation  Patient Details  Name: Lori Meyers MRN: [id number] Date of Birth: 09/20/1941 Referring Provider:  Ollen Gross, MD  Encounter Date: 04/04/2015      PT End of Session - 04/04/15 1049    Visit Number 1   Date for PT Re-Evaluation 06/03/15   PT Start Time 1007   PT Stop Time 1103   PT Time Calculation (min) 56 min   Activity Tolerance Patient tolerated treatment well   Behavior During Therapy [facility] for tasks assessed/performed      Past Medical History  Diagnosis Date  . Hypercholesteremia     under control  . Hypothyroidism     thyroidectomy-under control  . Arthritis     arms, knees  . Bursitis of left knee     Past Surgical History  Procedure Laterality Date  . Thyroidectomy  1990    "zapped"  . Finger surgery  ~2005    joint replaced on right hand middle finger  . 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 Tonsillectomy  as child  . Abdominal hysterectomy  1995    with bladder tac  . Appendectomy  5th grade  . Meniscus repair Left 2007    at office  . 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 Bilateral salpingoophorectomy    . Joint replacement    . Total knee arthroplasty Left 03/14/2015    Procedure: TOTAL LEFT KNEE ARTHROPLASTY;  Surgeon: Ollen Gross, MD;  Location: [facility];  Service: Orthopedics;  Laterality: Left;    There were no vitals filed for this visit.  Visit Diagnosis:  Decreased range of motion of left knee - Plan: PT plan of care cert/re-cert  Left knee pain - Plan: PT plan of care cert/re-cert  Weakness of left lower extremity - Plan: PT plan of care cert/re-cert      Subjective Assessment -  04/04/15 1012    Subjective Pt is a 74 y/o female who presents to OPPT s/p L TKA 02/11/15.  Pt d/c'ed from hospital to [facility]; d/c'ed 03/30/15 from SNF.  Pt presents today with decreased ROM and functional mobility.   Pertinent History R TKA   Limitations Standing;Walking;House hold activities   How long can you sit comfortably? unlimited   How long can you stand comfortably? 10 min   How long can you walk comfortably? 10 min   Patient Stated Goals improve ROM; return to water aerobics and walking   Currently in Pain? Yes   Pain Score 2    Pain Location Knee   Pain Orientation Left   Pain Descriptors / Indicators Discomfort   Pain Type Surgical pain   Pain Onset 1 to 4 weeks ago   Pain Frequency Intermittent   Aggravating Factors  ROM, standing, walking   Pain Relieving Factors ice, medication            [facility] PT Assessment - 04/04/15 1004    Assessment   Medical Diagnosis L TKA   Onset Date 03/01/15   Next MD Visit 04/26/15   Prior Therapy SNF   Precautions   Precautions None   Restrictions   Weight Bearing Restrictions No  Balance Screen   Has the patient fallen in the past 6 months No   Has the patient had a decrease in activity level because of a fear of falling?  No   Is the patient reluctant to leave their home because of a fear of falling?  No   Home Environment   Living Enviornment Private residence   Living Arrangements Spouse/significant other   Available Help at Discharge Family;Available PRN/intermittently   Type of Home House   Home Access Stairs to enter   Entrance Stairs-Number of Steps 1   Home Layout One level   Prior Function   Level of Independence Independent with basic ADLs;Independent with gait;Independent with transfers   Vocation Retired   [employer] retired Facilities manager   Leisure water aerobics, read, travel to [place]   Cognition   Overall Cognitive Status Within Functional Limits for tasks assessed    Observation/Other Assessments   Focus on Therapeutic Outcomes (FOTO)  49 (51% limited; predicted 38% limited)   AROM   AROM Assessment Site Knee   Right/Left Knee Right;Left   Right Knee Extension 4  from neutral   Right Knee Flexion 103   Left Knee Extension 6  from neutral   Left Knee Flexion 100   PROM   PROM Assessment Site Knee   Right/Left Knee Left   Left Knee Extension 4   Left Knee Flexion 109   Strength   Strength Assessment Site Hip;Knee   Right Hip Flexion 4/5   Right Hip ABduction 3+/5   Right Hip ADduction 4/5   Left Hip Flexion 3+/5   Left Hip ABduction 3+/5   Left Hip ADduction 4/5   Right/Left Knee Right;Left   Right Knee Flexion 4/5   Right Knee Extension 4/5   Left Knee Flexion 4/5   Left Knee Extension 3+/5   Palpation   Palpation tenderness to palpation along medial joint line; redness noted along lateral joint line (MD aware)   Ambulation/Gait   Ambulation/Gait Yes   Ambulation/Gait Assistance 5: Supervision   Ambulation Distance (Feet) 75 Feet   Assistive device Straight cane   Gait Pattern Decreased hip/knee flexion - left;Decreased stance time - left;Decreased step length - right;Antalgic                   [facility] Adult PT Treatment/Exercise - 04/04/15 1004    Knee/Hip Exercises: Aerobic   Stationary Bike x 8 min; seat 4   Modalities   Modalities Cryotherapy;Electrical Stimulation   Cryotherapy   Number Minutes Cryotherapy 15 Minutes   Cryotherapy Location Knee   Type of Cryotherapy Ice pack   Electrical Stimulation   Electrical Stimulation Location L knee   Electrical Stimulation Action IFC   Electrical Stimulation Parameters to tolerance x 15 min   Electrical Stimulation Goals Edema;Pain                  PT Short Term Goals - 04/04/15 1052    PT SHORT TERM GOAL #1   Title independent with HEP (05/02/15)   Time 4   Period Weeks   Status New   PT SHORT TERM GOAL #2   Title improve L knee AROM 3-110 for improved  mobility and function (05/02/15)   Time 4   Period Weeks   Status New   PT SHORT TERM GOAL #3   Title ambulate > 150' without device independently for improved strength and mobility. (05/02/15)   Time 4   Period Weeks  Status New           PT Long Term Goals - 04/04/15 1053    PT LONG TERM GOAL #1   Title independent with advanced HEP (05/30/15)   Time 8   Period Weeks   Status New   PT LONG TERM GOAL #2   Title improve L knee AROM 0-115 for improved function and mobility (05/30/15)   Time 8   Period Weeks   Status New   PT LONG TERM GOAL #3   Title ambulate without device without increase in pain 100% of the time (05/30/15)   Time 8   Period Weeks   Status New   PT LONG TERM GOAL #4   Title report return to water aerobics for improved function and strength (05/30/15)   Time 8   Period Weeks   Status New               Plan - 04/04/15 1050    Clinical Impression Statement Pt presents to OPPT s/p L TKA.  Pt demonstrates decreased strength and ROM affecting functional mobility and return to prior level of function.     Pt will benefit from skilled therapeutic intervention in order to improve on the following deficits Abnormal gait;Decreased range of motion;Difficulty walking;Impaired flexibility;Decreased activity tolerance;Pain;Decreased strength;Decreased balance   Rehab Potential Good   PT Frequency 3x / week   PT Duration 8 weeks  may decrease to 2x/wk after 4 weeks   PT Treatment/Interventions ADLs/Self Care Home Management;Electrical Stimulation;Cryotherapy;Functional mobility training;Neuromuscular re-education;Stair training;Manual techniques;Balance training;Gait training;Ultrasound;DME Instruction;Therapeutic exercise;Passive range of motion;Scar mobilization;Patient/family education;Therapeutic activities;Moist Heat   PT Next Visit Plan continue to work on ROM; LLE strengthening   Consulted and Agree with Plan of Care Patient          G-Codes - 04/04/15  1055    Functional Assessment Tool Used FOTO 51% limited   Functional Limitation Mobility: Walking and moving around   Mobility: Walking and Moving Around Current Status ([id number]([id number]) At least 40 percent but less than 60 percent impaired, limited or restricted   Mobility: Walking and Moving Around Goal Status [phone](G8979) At least 20 percent but less than 40 percent impaired, limited or restricted       Problem List Patient Active Problem List   Diagnosis Date Noted  . Osteoarthritis of left knee 03/18/2015  . Hypothyroidism 03/18/2015  . Acute blood loss anemia 06/02/2014  . Unspecified hypothyroidism 06/01/2014  . Pure hypercholesterolemia 06/01/2014  . Constipation 06/01/2014  . OA (osteoarthritis) of knee 05/24/2014   Clarita CraneStephanie F Ajanee Buren, PT, DPT 04/04/2015 11:05 AM  [facility] [address] [place], [place], [address] Phone: [phone]   Fax:  [phone]

## 2015-04-06 ENCOUNTER — Ambulatory Visit: Payer: Medicare Other | Admitting: Physical Therapy

## 2015-04-06 DIAGNOSIS — M25662 Stiffness of left knee, not elsewhere classified: Secondary | ICD-10-CM

## 2015-04-06 DIAGNOSIS — M25562 Pain in left knee: Secondary | ICD-10-CM

## 2015-04-06 DIAGNOSIS — R29898 Other symptoms and signs involving the musculoskeletal system: Secondary | ICD-10-CM

## 2015-04-06 DIAGNOSIS — M542 Cervicalgia: Secondary | ICD-10-CM | POA: Diagnosis not present

## 2015-04-06 NOTE — Therapy (Signed)
[facility] [address] [place], [place], [address] Phone: [phone]   Fax:  [phone]  Physical Therapy Treatment  Patient Details  Name: Lori CardGail W Sorce MRN: [id number] Date of Birth: 10/08/1941 Referring Provider:  Rodrigo RanPerini, Mark, MD  Encounter Date: 04/06/2015      PT End of Session - 04/06/15 1055    Visit Number 2   Date for PT Re-Evaluation 06/03/15   PT Start Time 1015   PT Stop Time 1109   PT Time Calculation (min) 54 min   Activity Tolerance Patient tolerated treatment well   Behavior During Therapy [facility] for tasks assessed/performed      Past Medical History  Diagnosis Date  . Hypercholesteremia     under control  . Hypothyroidism     thyroidectomy-under control  . Arthritis     arms, knees  . Bursitis of left knee     Past Surgical History  Procedure Laterality Date  . Thyroidectomy  1990    "zapped"  . Finger surgery  ~2005    joint replaced on right hand middle finger  . 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 Tonsillectomy  as child  . Abdominal hysterectomy  1995    with bladder tac  . Appendectomy  5th grade  . Meniscus repair Left 2007    at office  . 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 Bilateral salpingoophorectomy    . Joint replacement    . Total knee arthroplasty Left 03/14/2015    Procedure: TOTAL LEFT KNEE ARTHROPLASTY;  Surgeon: Ollen GrossFrank Aluisio, MD;  Location: [facility];  Service: Orthopedics;  Laterality: Left;    There were no vitals filed for this visit.  Visit Diagnosis:  Decreased range of motion of left knee  Left knee pain  Weakness of left lower extremity      Subjective Assessment - 04/06/15 1017    Subjective L knee stiff today, decreasing medication   Pertinent History R TKA   Patient Stated  Goals improve ROM; return to water aerobics and walking   Currently in Pain? Yes   Pain Score 3    Pain Location Knee   Pain Orientation Left   Pain Descriptors / Indicators Discomfort   Pain Type Surgical pain   Pain Onset 1 to 4 weeks ago   Pain Frequency Intermittent   Aggravating Factors  ROM, standing, walking   Pain Relieving Factors ice, medication                         [facility] Adult PT Treatment/Exercise - 04/06/15 1019    Knee/Hip Exercises: Aerobic   Stationary Bike x 6 min; seat 4   Elliptical NuStep L4 x 6 min   Cryotherapy   Number Minutes Cryotherapy 15 Minutes   Cryotherapy Location Knee   Type of Cryotherapy Ice pack   Electrical Stimulation   Electrical Stimulation Location L knee   Electrical Stimulation Action IFC   Electrical Stimulation Parameters to tolerance x 15 min   Electrical Stimulation Goals Edema;Pain   Manual Therapy   Manual Therapy Passive ROM   Passive ROM L knee flexion/extension   Knee/Hip Exercises: Machines for Strengthening   Cybex Knee Extension 5# 2x10; cues for LLE activation   Cybex  Knee Flexion 15# 2 x 10                  PT Short Term Goals - 04/06/15 1056    PT SHORT TERM GOAL #1   Title independent with HEP (05/02/15)   Status On-going   PT SHORT TERM GOAL #2   Title improve L knee AROM 3-110 for improved mobility and function (05/02/15)   Status On-going   PT SHORT TERM GOAL #3   Title ambulate > 150' without device independently for improved strength and mobility. (05/02/15)   Status On-going           PT Long Term Goals - 04/06/15 1056    PT LONG TERM GOAL #1   Title independent with advanced HEP (05/30/15)   Status On-going   PT LONG TERM GOAL #2   Title improve L knee AROM 0-115 for improved function and mobility (05/30/15)   Status On-going   PT LONG TERM GOAL #3   Title ambulate without device without increase in pain 100% of the time (05/30/15)   Status On-going   PT LONG TERM GOAL  #4   Title report return to water aerobics for improved function and strength (05/30/15)   Status On-going               Plan - 04/06/15 1056    Clinical Impression Statement Pt tolerated increased activity today; focus on ROM and strengthening.   PT Next Visit Plan continue to work on ROM; LLE strengthening   Consulted and Agree with Plan of Care Patient        Problem List Patient Active Problem List   Diagnosis Date Noted  . Osteoarthritis of left knee 03/18/2015  . Hypothyroidism 03/18/2015  . Acute blood loss anemia 06/02/2014  . Unspecified hypothyroidism 06/01/2014  . Pure hypercholesterolemia 06/01/2014  . Constipation 06/01/2014  . OA (osteoarthritis) of knee 05/24/2014   Clarita Crane, PT, DPT 04/06/2015 11:09 AM  [facility] [address] [place], [place], [address] Phone: [phone]   Fax:  [phone]

## 2015-04-08 ENCOUNTER — Encounter: Payer: Self-pay | Admitting: Physical Therapy

## 2015-04-08 ENCOUNTER — Ambulatory Visit: Payer: Medicare Other | Admitting: Physical Therapy

## 2015-04-08 DIAGNOSIS — M25562 Pain in left knee: Secondary | ICD-10-CM

## 2015-04-08 DIAGNOSIS — R29898 Other symptoms and signs involving the musculoskeletal system: Secondary | ICD-10-CM

## 2015-04-08 DIAGNOSIS — M25662 Stiffness of left knee, not elsewhere classified: Secondary | ICD-10-CM

## 2015-04-08 DIAGNOSIS — M542 Cervicalgia: Secondary | ICD-10-CM | POA: Diagnosis not present

## 2015-04-08 NOTE — Therapy (Signed)
[facility] [address] [place], [place], [address] Phone: [phone]   Fax:  [phone]  Physical Therapy Treatment  Patient Details  Name: Lori CardGail W Hennington MRN: [id number] Date of Birth: 08/29/1941 Referring Provider:  Rodrigo RanPerini, Mark, MD  Encounter Date: 04/08/2015      PT End of Session - 04/08/15 1051    Visit Number 3   Date for PT Re-Evaluation 06/03/15   PT Start Time 1010   PT Stop Time 1111   PT Time Calculation (min) 61 min      Past Medical History  Diagnosis Date  . Hypercholesteremia     under control  . Hypothyroidism     thyroidectomy-under control  . Arthritis     arms, knees  . Bursitis of left knee     Past Surgical History  Procedure Laterality Date  . Thyroidectomy  1990    "zapped"  . Finger surgery  ~2005    joint replaced on right hand middle finger  . 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 Tonsillectomy  as child  . Abdominal hysterectomy  1995    with bladder tac  . Appendectomy  5th grade  . Meniscus repair Left 2007    at office  . 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 Bilateral salpingoophorectomy    . Joint replacement    . Total knee arthroplasty Left 03/14/2015    Procedure: TOTAL LEFT KNEE ARTHROPLASTY;  Surgeon: Ollen GrossFrank Aluisio, MD;  Location: [facility];  Service: Orthopedics;  Laterality: Left;    There were no vitals filed for this visit.  Visit Diagnosis:  Decreased range of motion of left knee  Left knee pain  Weakness of left lower extremity      Subjective Assessment - 04/08/15 1010    Subjective Just really hurting.  Pain in the left knee a 5/10 with pain meds.  Just stiff.   Currently in Pain? Yes   Pain Score 5    Pain Location Knee   Pain Orientation Left   Pain Descriptors / Indicators  Aching;Tightness   Pain Type Surgical pain   Pain Onset 1 to 4 weeks ago   Aggravating Factors  being up on the leg   Pain Relieving Factors ice and pain meds                         [facility] Adult PT Treatment/Exercise - 04/08/15 0001    Knee/Hip Exercises: Aerobic   Stationary Bike x 6 min; seat 4   Elliptical NuStep L3 x 6 min   Knee/Hip Exercises: Machines for Strengthening   Cybex Knee Extension 5# 2x10; cues for LLE activation   Cybex Knee Flexion 15# 2 x 10   Cybex Leg Press 20# and then no weight for better flexion   Cryotherapy   Number Minutes Cryotherapy 15 Minutes   Cryotherapy Location Knee   Type of Cryotherapy Ice pack   Electrical Stimulation   Electrical Stimulation Location L knee   Electrical Stimulation Action IFC   Electrical Stimulation Goals Edema;Pain   Manual Therapy   Manual Therapy Myofascial release;Passive ROM   Myofascial Release scar and fascial mobs around the knee   Passive ROM L knee flexion/extension  PT Short Term Goals - 04/06/15 1056    PT SHORT TERM GOAL #1   Title independent with HEP (05/02/15)   Status On-going   PT SHORT TERM GOAL #2   Title improve L knee AROM 3-110 for improved mobility and function (05/02/15)   Status On-going   PT SHORT TERM GOAL #3   Title ambulate > 150' without device independently for improved strength and mobility. (05/02/15)   Status On-going           PT Long Term Goals - 04/06/15 1056    PT LONG TERM GOAL #1   Title independent with advanced HEP (05/30/15)   Status On-going   PT LONG TERM GOAL #2   Title improve L knee AROM 0-115 for improved function and mobility (05/30/15)   Status On-going   PT LONG TERM GOAL #3   Title ambulate without device without increase in pain 100% of the time (05/30/15)   Status On-going   PT LONG TERM GOAL #4   Title report return to water aerobics for improved function and strength (05/30/15)   Status On-going                Plan - 04/08/15 1052    Clinical Impression Statement Very tight, seems painful with flexion, doing well with gait, just stiff, uses a SPC   PT Next Visit Plan continue to work on ROM; LLE strengthening   Consulted and Agree with Plan of Care Patient        Problem List Patient Active Problem List   Diagnosis Date Noted  . Osteoarthritis of left knee 03/18/2015  . Hypothyroidism 03/18/2015  . Acute blood loss anemia 06/02/2014  . Unspecified hypothyroidism 06/01/2014  . Pure hypercholesterolemia 06/01/2014  . Constipation 06/01/2014  . OA (osteoarthritis) of knee 05/24/2014    Jearld Lesch, PT 04/08/2015, 10:53 AM  [facility] [address] [place], [place], [address] Phone: [phone]   Fax:  [phone]

## 2015-04-11 ENCOUNTER — Ambulatory Visit: Payer: Medicare Other | Attending: Orthopedic Surgery | Admitting: Physical Therapy

## 2015-04-11 DIAGNOSIS — M5032 Other cervical disc degeneration, mid-cervical region: Secondary | ICD-10-CM | POA: Diagnosis not present

## 2015-04-11 DIAGNOSIS — R29898 Other symptoms and signs involving the musculoskeletal system: Secondary | ICD-10-CM

## 2015-04-11 DIAGNOSIS — M25562 Pain in left knee: Secondary | ICD-10-CM

## 2015-04-11 DIAGNOSIS — M542 Cervicalgia: Secondary | ICD-10-CM | POA: Insufficient documentation

## 2015-04-11 DIAGNOSIS — M25662 Stiffness of left knee, not elsewhere classified: Secondary | ICD-10-CM

## 2015-04-11 NOTE — Therapy (Signed)
[facility] [address] [place], [place], [address] Phone: [phone]   Fax:  [phone]  Physical Therapy Treatment  Patient Details  Name: Lori Meyers MRN: [id number] Date of Birth: [birth year] Referring Provider:  Ollen Gross, MD  Encounter Date: 04/11/2015      PT End of Session - 04/11/15 1101    Visit Number 4   Date for PT Re-Evaluation 06/03/15   PT Start Time 1015   PT Stop Time 1117   PT Time Calculation (min) 62 min   Activity Tolerance Patient tolerated treatment well   Behavior During Therapy [facility] for tasks assessed/performed      Past Medical History  Diagnosis Date  . Hypercholesteremia     under control  . Hypothyroidism     thyroidectomy-under control  . Arthritis     arms, knees  . Bursitis of left knee     Past Surgical History  Procedure Laterality Date  . Thyroidectomy  1990    "zapped"  . Finger surgery  ~2005    joint replaced on right hand middle finger  . 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 Tonsillectomy  as child  . Abdominal hysterectomy  1995    with bladder tac  . Appendectomy  5th grade  . Meniscus repair Left 2007    at office  . 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 Bilateral salpingoophorectomy    . Joint replacement    . Total knee arthroplasty Left 03/14/2015    Procedure: TOTAL LEFT KNEE ARTHROPLASTY;  Surgeon: Ollen Gross, MD;  Location: [facility];  Service: Orthopedics;  Laterality: Left;    There were no vitals filed for this visit.  Visit Diagnosis:  Decreased range of motion of left knee  Left knee pain  Weakness of left lower extremity      Subjective Assessment - 04/11/15 1019    Subjective L knee feels "pretty good"   Patient Stated Goals improve ROM; return to water  aerobics and walking   Currently in Pain? Yes   Pain Score 3    Pain Location Knee   Pain Orientation Left   Pain Descriptors / Indicators Aching;Tightness   Pain Type Surgical pain   Pain Onset 1 to 4 weeks ago   Pain Frequency Intermittent                         [facility] Adult PT Treatment/Exercise - 04/11/15 1020    Knee/Hip Exercises: Aerobic   Stationary Bike x 6 min; seat 3; partial revolutions   Elliptical NuStep L6 x 6 min   Knee/Hip Exercises: Machines for Strengthening   Cybex Knee Extension 5# 3x10; cues for LLE activation   Cybex Knee Flexion 20# 3x10   Knee/Hip Exercises: Supine   Short Arc Quad Sets Strengthening;Left;10 reps   Short Arc Quad Sets Limitations cues for [employer]   Straight Leg Raise with External Rotation Left;10 reps   Straight Leg Raise with External Rotation Limitations cues for TKE and no extensor lag   Modalities   Modalities Cryotherapy;Electrical Stimulation   Cryotherapy   Number Minutes Cryotherapy 15 Minutes   Cryotherapy Location Knee   Type of Cryotherapy Ice pack   Emergency planning/management officer  L knee   Electrical Stimulation Action IFC   Electrical Stimulation Parameters to tolerance x 15 min   Electrical Stimulation Goals Edema;Pain                PT Education - 04/11/15 1101    Education provided Yes   Education Details retrograde massage for edema management; monitoring of redness along incision   Person(s) Educated Patient   Methods Explanation   Comprehension Verbalized understanding          PT Short Term Goals - 04/06/15 1056    PT SHORT TERM GOAL #1   Title independent with HEP (05/02/15)   Status On-going   PT SHORT TERM GOAL #2   Title improve L knee AROM 3-110 for improved mobility and function (05/02/15)   Status On-going   PT SHORT TERM GOAL #3   Title ambulate > 150' without device independently for improved strength and mobility. (05/02/15)   Status On-going            PT Long Term Goals - 04/06/15 1056    PT LONG TERM GOAL #1   Title independent with advanced HEP (05/30/15)   Status On-going   PT LONG TERM GOAL #2   Title improve L knee AROM 0-115 for improved function and mobility (05/30/15)   Status On-going   PT LONG TERM GOAL #3   Title ambulate without device without increase in pain 100% of the time (05/30/15)   Status On-going   PT LONG TERM GOAL #4   Title report return to water aerobics for improved function and strength (05/30/15)   Status On-going               Plan - 04/11/15 1101    Clinical Impression Statement Pt amb without SPC however demonstrates decreased TKE with heel strike.  Will continue to benefit from PT to maximize function.   PT Next Visit Plan work on TKE, LLE strengthening   Consulted and Agree with Plan of Care Patient        Problem List Patient Active Problem List   Diagnosis Date Noted  . Osteoarthritis of left knee 03/18/2015  . Hypothyroidism 03/18/2015  . Acute blood loss anemia 06/02/2014  . Unspecified hypothyroidism 06/01/2014  . Pure hypercholesterolemia 06/01/2014  . Constipation 06/01/2014  . OA (osteoarthritis) of knee 05/24/2014   Clarita CraneStephanie F Ailene Royal, PT, DPT 04/11/2015 11:49 AM  [facility] [address] [place], [place], [address] Phone: [phone]   Fax:  [phone]

## 2015-04-13 ENCOUNTER — Ambulatory Visit: Payer: Medicare Other | Admitting: Rehabilitation

## 2015-04-13 DIAGNOSIS — M25562 Pain in left knee: Secondary | ICD-10-CM

## 2015-04-13 DIAGNOSIS — M25662 Stiffness of left knee, not elsewhere classified: Secondary | ICD-10-CM

## 2015-04-13 DIAGNOSIS — M542 Cervicalgia: Secondary | ICD-10-CM | POA: Diagnosis not present

## 2015-04-13 DIAGNOSIS — R29898 Other symptoms and signs involving the musculoskeletal system: Secondary | ICD-10-CM

## 2015-04-13 NOTE — Therapy (Signed)
[facility] [address] [place], [place], [address] Phone: [phone]   Fax:  [phone]  Physical Therapy Treatment  Patient Details  Name: Lori Meyers MRN: [id number] Date of Birth: 01/05/1941 Referring Provider:  Ollen GrossAluisio, Frank, MD  Encounter Date: 04/13/2015      PT End of Session - 04/13/15 1106    Visit Number 5   Date for PT Re-Evaluation 06/03/15      Past Medical History  Diagnosis Date  . Hypercholesteremia     under control  . Hypothyroidism     thyroidectomy-under control  . Arthritis     arms, knees  . Bursitis of left knee     Past Surgical History  Procedure Laterality Date  . Thyroidectomy  1990    "zapped"  . Finger surgery  ~2005    joint replaced on right hand middle finger  . 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 Tonsillectomy  as child  . Abdominal hysterectomy  1995    with bladder tac  . Appendectomy  5th grade  . Meniscus repair Left 2007    at office  . 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 Bilateral salpingoophorectomy    . Joint replacement    . Total knee arthroplasty Left 03/14/2015    Procedure: TOTAL LEFT KNEE ARTHROPLASTY;  Surgeon: Ollen GrossFrank Aluisio, MD;  [facility]: [facility];  Service: Orthopedics;  Laterality: Left;    There were no vitals filed for this visit.  Visit Diagnosis:  Decreased range of motion of left knee  Left knee pain  Weakness of left lower extremity      Subjective Assessment - 04/13/15 1019    Subjective It's improving   Currently in Pain? Yes   Pain Score 2    Pain Location Knee   Pain Orientation Left   Pain Descriptors / Indicators Discomfort   Pain Type Surgical pain                         [facility] Adult PT Treatment/Exercise - 04/13/15 0001    Exercises   Exercises Knee/Hip;Ankle   Knee/Hip Exercises: Aerobic   Stationary Bike x 6 min seat 5 to 3 full revolutions   Elliptical NuStep L6 x 6 min   Knee/Hip Exercises: Machines for Strengthening   Cybex Knee Extension 10#  3x10   Cybex Knee Flexion 20# 3x 10   Cybex Leg Press 20# 3x10   Knee/Hip Exercises: Seated   Long Arc Quad 2 sets;10 reps   Knee/Hip Exercises: Supine   Short Arc Quad Sets Strengthening;1 set;10 reps   Straight Leg Raise with External Rotation Left;2 sets;10 reps   Modalities   Modalities Cryotherapy;Electrical Stimulation   Cryotherapy   Number Minutes Cryotherapy 15 Minutes   Cryotherapy Location Knee   Type of Cryotherapy Ice pack   Electrical Stimulation   Electrical Stimulation Location L knee   Electrical Stimulation Action IFC   Electrical Stimulation Parameters to tolerance   Electrical Stimulation Goals Pain   Ankle Exercises: Stretches   Gastroc Stretch 1 rep;30 seconds  bil   Ankle Exercises: Standing   Heel Raises 10 reps  3 way in/out/straight  PT Short Term Goals - 04/06/15 1056    PT SHORT TERM GOAL #1   Title independent with HEP (05/02/15)   Status On-going   PT SHORT TERM GOAL #2   Title improve L knee AROM 3-110 for improved mobility and function (05/02/15)   Status On-going   PT SHORT TERM GOAL #3   Title ambulate > 150' without device independently for improved strength and mobility. (05/02/15)   Status On-going           PT Long Term Goals - 04/06/15 1056    PT LONG TERM GOAL #1   Title independent with advanced HEP (05/30/15)   Status On-going   PT LONG TERM GOAL #2   Title improve L knee AROM 0-115 for improved function and mobility (05/30/15)   Status On-going   PT LONG TERM GOAL #3   Title ambulate without device without increase in pain 100% of the time (05/30/15)   Status On-going   PT LONG TERM GOAL #4   Title report return to water aerobics for improved function and strength  (05/30/15)   Status On-going               Plan - 04/13/15 1221    Clinical Impression Statement Pt progressing with strengthening. Continues decreased TKE with gait.    Pt will benefit from skilled therapeutic intervention in order to improve on the following deficits Abnormal gait;Decreased range of motion;Difficulty walking;Impaired flexibility;Decreased activity tolerance;Pain;Decreased strength;Decreased balance   Rehab Potential Good   PT Duration 8 weeks   PT Treatment/Interventions ADLs/Self Care Home Management;Electrical Stimulation;Cryotherapy;Functional mobility training;Neuromuscular re-education;Stair training;Manual techniques;Balance training;Gait training;Ultrasound;DME Instruction;Therapeutic exercise;Passive range of motion;Scar mobilization;Patient/family education;Therapeutic activities;Moist Heat   PT Next Visit Plan Work on [employer] and strengthening, take measurements   Consulted and Agree with Plan of Care Patient        Problem List Patient Active Problem List   Diagnosis Date Noted  . Osteoarthritis of left knee 03/18/2015  . Hypothyroidism 03/18/2015  . Acute blood loss anemia 06/02/2014  . Unspecified hypothyroidism 06/01/2014  . Pure hypercholesterolemia 06/01/2014  . Constipation 06/01/2014  . OA (osteoarthritis) of knee 05/24/2014    Solon PalmJulie Emmogene Simson PT  04/13/2015, 12:29 PM  [facility] [address] [place], [place], [address] Phone: [phone]   Fax:  [phone]

## 2015-04-15 ENCOUNTER — Ambulatory Visit: Payer: Medicare Other | Admitting: Physical Therapy

## 2015-04-15 ENCOUNTER — Encounter: Payer: Self-pay | Admitting: Physical Therapy

## 2015-04-15 DIAGNOSIS — M25662 Stiffness of left knee, not elsewhere classified: Secondary | ICD-10-CM

## 2015-04-15 DIAGNOSIS — R29898 Other symptoms and signs involving the musculoskeletal system: Secondary | ICD-10-CM

## 2015-04-15 DIAGNOSIS — M542 Cervicalgia: Secondary | ICD-10-CM | POA: Diagnosis not present

## 2015-04-15 DIAGNOSIS — M25562 Pain in left knee: Secondary | ICD-10-CM

## 2015-04-15 NOTE — Therapy (Signed)
[place], [place], [address] Phone: [phone]   Fax:  [phone]  Physical Therapy Treatment  Patient Details  Name: Lori Meyers MRN: [id number] Date of Birth: 09-03-1941 Referring Provider:  Gaynelle Arabian, MD  Encounter Date: 04/15/2015      PT End of Session - 04/15/15 1055    Visit Number 6   Date for PT Re-Evaluation 06/03/15   PT Start Time 1012   PT Stop Time 1115   PT Time Calculation (min) 63 min      Past Medical History  Diagnosis Date  . Hypercholesteremia     under control  . Hypothyroidism     thyroidectomy-under control  . Arthritis     arms, knees  . Bursitis of left knee     Past Surgical History  Procedure Laterality Date  . Thyroidectomy  1990    "zapped"  . Finger surgery  ~2005    joint replaced on right hand middle finger  . 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 Tonsillectomy  as child  . Abdominal hysterectomy  1995    with bladder tac  . Appendectomy  5th grade  . Meniscus repair Left 2007    at office  . 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 Bilateral salpingoophorectomy    . Joint replacement    . Total knee arthroplasty Left 03/14/2015    Procedure: TOTAL LEFT KNEE ARTHROPLASTY;  Surgeon: Gaynelle Arabian, MD;  Location: [facility];  Service: Orthopedics;  Laterality: Left;    There were no vitals filed for this visit.  Visit Diagnosis:  Decreased range of motion of left knee  Left knee pain  Weakness of left lower extremity      Subjective Assessment - 04/15/15 1019    Subjective Just a little stiff and sore, had the rain yesterday and did not do much   Currently in Pain? Yes   Pain Score 3    Pain Location Knee   Pain Orientation Left   Pain Descriptors / Indicators Aching    Aggravating Factors  weather   Pain Relieving Factors rest and movments            [facility] PT Assessment - 04/15/15 0001    AROM   Left Knee Extension 10   Left Knee Flexion 110                     [facility] Adult PT Treatment/Exercise - 04/15/15 0001    Ambulation/Gait   Gait Comments 400 feet without assistive device, cues to bend knee and take longer steps   High Level Balance   High Level Balance Activities Side stepping;Backward walking;Marching forwards;Tandem walking   High Level Balance Comments resisted gait   Knee/Hip Exercises: Aerobic   Elliptical NuStep L6 x 6 min   Knee/Hip Exercises: Machines for Strengthening   Cybex Knee Extension 10#  3x10   Cybex Knee Flexion 20# 3x 10   Cybex Leg Press 20# 3x1o   Cryotherapy   Number Minutes Cryotherapy 15 Minutes   Cryotherapy Location Knee   Electrical Stimulation   Electrical Stimulation Location left knee   Electrical Stimulation Action IFC   Electrical Stimulation Parameters to tolerance   Electrical Stimulation Goals Pain  Manual Therapy   Manual Therapy Myofascial release;Passive ROM   Myofascial Release scar and fascial mobs around the knee   Passive ROM L knee flexion/extension   Ankle Exercises: Stretches   Gastroc Stretch 3 reps;20 seconds                  PT Short Term Goals - 04/06/15 1056    PT SHORT TERM GOAL #1   Title independent with HEP (05/02/15)   Status On-going   PT SHORT TERM GOAL #2   Title improve L knee AROM 3-110 for improved mobility and function (05/02/15)   Status On-going   PT SHORT TERM GOAL #3   Title ambulate > 150' without device independently for improved strength and mobility. (05/02/15)   Status On-going           PT Long Term Goals - 04/15/15 1057    PT LONG TERM GOAL #1   Title independent with advanced HEP (05/30/15)   Status Partially Met   PT LONG TERM GOAL #2   Title improve L knee AROM 0-115 for improved function and mobility (05/30/15)    Status On-going   PT LONG TERM GOAL #4   Title report return to water aerobics for improved function and strength (05/30/15)   Status Achieved               Plan - 04/15/15 1056    Clinical Impression Statement Decreased ROM, AROM at edge of bed was 10-110 degrees, has some swelling and redness around the knee   PT Next Visit Plan Work on [employer] and strengthening, take measurements   Consulted and Agree with Plan of Care Patient        Problem List Patient Active Problem List   Diagnosis Date Noted  . Osteoarthritis of left knee 03/18/2015  . Hypothyroidism 03/18/2015  . Acute blood loss anemia 06/02/2014  . Unspecified hypothyroidism 06/01/2014  . Pure hypercholesterolemia 06/01/2014  . Constipation 06/01/2014  . OA (osteoarthritis) of knee 05/24/2014    Sumner Boast, PT 04/15/2015, 10:59 AM  [place] [address] [place], [place], [address] Phone: [phone]   Fax:  [phone]

## 2015-04-18 ENCOUNTER — Encounter: Payer: Self-pay | Admitting: Physical Therapy

## 2015-04-18 ENCOUNTER — Ambulatory Visit: Payer: Medicare Other | Admitting: Physical Therapy

## 2015-04-18 DIAGNOSIS — M25562 Pain in left knee: Secondary | ICD-10-CM

## 2015-04-18 DIAGNOSIS — M542 Cervicalgia: Secondary | ICD-10-CM | POA: Diagnosis not present

## 2015-04-18 DIAGNOSIS — M25662 Stiffness of left knee, not elsewhere classified: Secondary | ICD-10-CM

## 2015-04-18 NOTE — Therapy (Signed)
[place], [place], [address] Phone: [phone]   Fax:  [phone]  Physical Therapy Treatment  Patient Details  Name: Lori Meyers MRN: [id number] Date of Birth: 29-Jan-1941 Referring Provider:  Gaynelle Arabian, MD  Encounter Date: 04/18/2015      PT End of Session - 04/18/15 1015    Visit Number 7   Date for PT Re-Evaluation 06/03/15   PT Start Time 0920   PT Stop Time 1030   PT Time Calculation (min) 70 min      Past Medical History  Diagnosis Date  . Hypercholesteremia     under control  . Hypothyroidism     thyroidectomy-under control  . Arthritis     arms, knees  . Bursitis of left knee     Past Surgical History  Procedure Laterality Date  . Thyroidectomy  1990    "zapped"  . Finger surgery  ~2005    joint replaced on right hand middle finger  . 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 Tonsillectomy  as child  . Abdominal hysterectomy  1995    with bladder tac  . Appendectomy  5th grade  . Meniscus repair Left 2007    at office  . 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 Bilateral salpingoophorectomy    . Joint replacement    . Total knee arthroplasty Left 03/14/2015    Procedure: TOTAL LEFT KNEE ARTHROPLASTY;  Surgeon: Gaynelle Arabian, MD;  [facility]: [facility];  Service: Orthopedics;  Laterality: Left;    There were no vitals filed for this visit.  Visit Diagnosis:  Decreased range of motion of left knee  Left knee pain      Subjective Assessment - 04/18/15 0930    Subjective Stiff, but not bad, trying to take less of the pain meds   Currently in Pain? Yes   Pain Score 3    Pain Location Knee   Pain Orientation Left   Pain Descriptors / Indicators Aching   Pain Type Surgical pain   Pain Frequency  Constant            [facility] PT Assessment - 04/18/15 0001    AROM   Left Knee Extension 10   Left Knee Flexion 110   PROM   Left Knee Extension 2   Left Knee Flexion 115                     [facility] Adult PT Treatment/Exercise - 04/18/15 0001    Ambulation/Gait   Stairs Yes   Stairs Assistance 4: Min guard   Stair Management Technique Alternating pattern   Gait Comments 700 feet, cues for knee bend and step length   High Level Balance   High Level Balance Activities Side stepping;Backward walking;Marching forwards;Tandem walking   Knee/Hip Exercises: Stretches   Gastroc Stretch 3 reps;20 seconds   Knee/Hip Exercises: Aerobic   Stationary Bike x 6 min seat 5 to 3 full revolutions   Elliptical NuStep L6 x 6 min   Knee/Hip Exercises: Machines for Strengthening   Cybex Knee Extension 10#  3x10   Cybex Knee Flexion 20# 3x 10   Cybex Leg Press 20# 3x1o   Cryotherapy   Number Minutes Cryotherapy 15 Minutes   Cryotherapy  Location Knee   Type of Cryotherapy Ice pack   Electrical Stimulation   Electrical Stimulation Location left knee   Electrical Stimulation Action IFC   Electrical Stimulation Parameters tolerance   Electrical Stimulation Goals Pain   Manual Therapy   Manual Therapy Myofascial release;Passive ROM   Myofascial Release scar and fascial mobs around the knee   Passive ROM L knee flexion/extension                  PT Short Term Goals - 04/06/15 1056    PT SHORT TERM GOAL #1   Title independent with HEP (05/02/15)   Status On-going   PT SHORT TERM GOAL #2   Title improve L knee AROM 3-110 for improved mobility and function (05/02/15)   Status On-going   PT SHORT TERM GOAL #3   Title ambulate > 150' without device independently for improved strength and mobility. (05/02/15)   Status On-going           PT Long Term Goals - 04/15/15 1057    PT LONG TERM GOAL #1   Title independent with advanced HEP (05/30/15)   Status Partially Met    PT LONG TERM GOAL #2   Title improve L knee AROM 0-115 for improved function and mobility (05/30/15)   Status On-going   PT LONG TERM GOAL #4   Title report return to water aerobics for improved function and strength (05/30/15)   Status Achieved               Plan - 04/18/15 1015    Clinical Impression Statement Stiff knee today, some warmth.  Stiff iwth gait, very difficult for her to do step over step going down stairs   PT Next Visit Plan continue to work on ROM        Problem List Patient Active Problem List   Diagnosis Date Noted  . Osteoarthritis of left knee 03/18/2015  . Hypothyroidism 03/18/2015  . Acute blood loss anemia 06/02/2014  . Unspecified hypothyroidism 06/01/2014  . Pure hypercholesterolemia 06/01/2014  . Constipation 06/01/2014  . OA (osteoarthritis) of knee 05/24/2014    Sumner Boast, PT 04/18/2015, 10:18 AM  [place] [address] [place], [place], [address] Phone: [phone]   Fax:  [phone]

## 2015-04-19 ENCOUNTER — Ambulatory Visit: Payer: Medicare Other | Admitting: Physical Therapy

## 2015-04-19 DIAGNOSIS — R29898 Other symptoms and signs involving the musculoskeletal system: Secondary | ICD-10-CM

## 2015-04-19 DIAGNOSIS — M542 Cervicalgia: Secondary | ICD-10-CM | POA: Diagnosis not present

## 2015-04-19 DIAGNOSIS — M25562 Pain in left knee: Secondary | ICD-10-CM

## 2015-04-19 DIAGNOSIS — M25662 Stiffness of left knee, not elsewhere classified: Secondary | ICD-10-CM

## 2015-04-19 NOTE — Therapy (Signed)
[place], [place], [address] Phone: [phone]   Fax:  [phone]  Physical Therapy Treatment  Patient Details  Name: Lori Meyers MRN: [id number] Date of Birth: 12-10-1941 Referring Provider:  Gaynelle Arabian, MD  Encounter Date: 04/19/2015      PT End of Session - 04/19/15 0933    Visit Number 8   Date for PT Re-Evaluation 06/03/15   PT Start Time 9741   PT Stop Time 1028   PT Time Calculation (min) 54 min      Past Medical History  Diagnosis Date  . Hypercholesteremia     under control  . Hypothyroidism     thyroidectomy-under control  . Arthritis     arms, knees  . Bursitis of left knee     Past Surgical History  Procedure Laterality Date  . Thyroidectomy  1990    "zapped"  . Finger surgery  ~2005    joint replaced on right hand middle finger  . 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 Tonsillectomy  as child  . Abdominal hysterectomy  1995    with bladder tac  . Appendectomy  5th grade  . Meniscus repair Left 2007    at office  . 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 Bilateral salpingoophorectomy    . Joint replacement    . Total knee arthroplasty Left 03/14/2015    Procedure: TOTAL LEFT KNEE ARTHROPLASTY;  Surgeon: Gaynelle Arabian, MD;  Location: [facility];  Service: Orthopedics;  Laterality: Left;    There were no vitals filed for this visit.  Visit Diagnosis:  Decreased range of motion of left knee  Left knee pain  Weakness of left lower extremity      Subjective Assessment - 04/19/15 0935    Subjective stiff today, can't figure out how to sleep comfortably   Pertinent History R TKA   Patient Stated Goals improve ROM; return to water aerobics and walking   Currently in Pain? Yes   Pain Score 3    Pain Location Knee   Pain Orientation Left                         [facility] Adult PT Treatment/Exercise - 04/19/15 0001    High Level Balance   High Level Balance Comments resisted walking 4 way 30 feet ea.   Knee/Hip Exercises: Aerobic   Stationary Bike x 6 min seat 5 to 3 full revolutions   Elliptical NuStep L6 x 6 min   Knee/Hip Exercises: Machines for Strengthening   Cybex Knee Extension 10#  3x10   Cybex Knee Flexion 25# 3x 10   Cybex Leg Press 30# 3x10   Knee/Hip Exercises: Standing   Other Standing Knee Exercises --   Cryotherapy   Number Minutes Cryotherapy 15 Minutes   Cryotherapy Location Knee   Type of Cryotherapy Ice pack   Electrical Stimulation   Electrical Stimulation Location Lt Knee   Electrical Stimulation Action IFC   Electrical Stimulation Parameters 1-_0    Electrical Stimulation Goals Edema   Ankle Exercises: Standing   SLS SLS Lt x3; with ball toss (difficult) mutiple trials; with OH lift with ball multiple trials  PT Short Term Goals - 04/06/15 1056    PT SHORT TERM GOAL #1   Title independent with HEP (05/02/15)   Status On-going   PT SHORT TERM GOAL #2   Title improve L knee AROM 3-110 for improved mobility and function (05/02/15)   Status On-going   PT SHORT TERM GOAL #3   Title ambulate > 150' without device independently for improved strength and mobility. (05/02/15)   Status On-going           PT Long Term Goals - 04/15/15 1057    PT LONG TERM GOAL #1   Title independent with advanced HEP (05/30/15)   Status Partially Met   PT LONG TERM GOAL #2   Title improve L knee AROM 0-115 for improved function and mobility (05/30/15)   Status On-going   PT LONG TERM GOAL #4   Title report return to water aerobics for improved function and strength (05/30/15)   Status Achieved               Plan - 04/19/15 1019    Clinical Impression Statement Patient complained of increased stiffness today.  Tolerated increase in weights today well. Difficulty with higher level SLS activities.   PT Next Visit Plan continue to work on ROM, balance   Consulted and Agree with Plan of Care Patient        Problem List Patient Active Problem List   Diagnosis Date Noted  . Osteoarthritis of left knee 03/18/2015  . Hypothyroidism 03/18/2015  . Acute blood loss anemia 06/02/2014  . Unspecified hypothyroidism 06/01/2014  . Pure hypercholesterolemia 06/01/2014  . Constipation 06/01/2014  . OA (osteoarthritis) of knee 05/24/2014    Madelyn Flavors PT  04/19/2015, 10:23 AM  [place] [address] [facility] [place], [place], [address] Phone: [phone]   Fax:  [phone]

## 2015-04-20 ENCOUNTER — Ambulatory Visit: Payer: Medicare Other

## 2015-04-22 ENCOUNTER — Ambulatory Visit: Payer: Medicare Other | Admitting: Physical Therapy

## 2015-04-22 DIAGNOSIS — R29898 Other symptoms and signs involving the musculoskeletal system: Secondary | ICD-10-CM

## 2015-04-22 DIAGNOSIS — M25662 Stiffness of left knee, not elsewhere classified: Secondary | ICD-10-CM

## 2015-04-22 DIAGNOSIS — M25562 Pain in left knee: Secondary | ICD-10-CM

## 2015-04-22 DIAGNOSIS — M542 Cervicalgia: Secondary | ICD-10-CM | POA: Diagnosis not present

## 2015-04-22 NOTE — Therapy (Signed)
[place], [place], [address] Phone: [phone]   Fax:  [phone]  Physical Therapy Treatment  Patient Details  Name: Lori Meyers MRN: [id number] Date of Birth: 1941/06/23 Referring Provider:  Gaynelle Arabian, MD  Encounter Date: 04/22/2015      PT End of Session - 04/22/15 1112    Visit Number 9   Date for PT Re-Evaluation 06/03/15   PT Start Time 6962   PT Stop Time 1120   PT Time Calculation (min) 65 min   Activity Tolerance Patient tolerated treatment well   Behavior During Therapy [facility] for tasks assessed/performed      Past Medical History  Diagnosis Date   Hypercholesteremia     under control   Hypothyroidism     thyroidectomy-under control   Arthritis     arms, knees   Bursitis of left knee     Past Surgical History  Procedure Laterality Date   Thyroidectomy  1990    "zapped"   Finger surgery  ~2005    joint replaced on right hand middle finger   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   Tonsillectomy  as child   Abdominal hysterectomy  1995    with bladder tac   Appendectomy  5th grade   Meniscus repair Left 2007    at office   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  Visit Diagnosis:  Decreased range of motion of left knee  Left knee pain  Weakness of left lower extremity      Subjective Assessment - 04/22/15 1109    Subjective This knee just hurts more than the Right one did.     Limitations  Standing;Walking;House hold activities   Patient Stated Goals improve ROM; return to water aerobics and walking   Currently in Pain? Yes   Pain Score 3    Pain Location Knee   Pain Orientation Left                         [facility] Adult PT Treatment/Exercise - 04/22/15 0001    Ambulation/Gait   Stairs Yes   Stairs Assistance 5: Supervision   Stair Management Technique One rail Right  reciprocal   Knee/Hip Exercises: Stretches   Gastroc Stretch 3 reps;20 seconds   Knee/Hip Exercises: Aerobic   Stationary Bike x 6 min seat 5 to 3 full revolutions   Knee/Hip Exercises: Machines for Strengthening   Cybex Knee Extension 10#  3x10   Cybex Knee Flexion 25# 3x 10   Cybex Leg Press 30# 3x10   Knee/Hip Exercises: Standing   Terminal Knee Extension Strengthening;10 reps   Theraband Level (Terminal Knee Extension) Other (comment)  ball to wall   Functional Squat 2 sets;10 reps   Cryotherapy   Number Minutes Cryotherapy 15 Minutes   Cryotherapy Location Knee   Type of Cryotherapy Ice pack   Electrical Stimulation   Electrical Stimulation Location Lt Knee   Electrical  Stimulation Action IFC   Electrical Stimulation Parameters 1-10 Hz   Electrical Stimulation Goals Edema   Manual Therapy   Manual Therapy Myofascial release;Passive ROM   Myofascial Release scar and fascial mobs around the knee   Passive ROM L knee flexion/extension   Ankle Exercises: Standing   SLS SLS Right LE march, light HHA. 2x7                  PT Short Term Goals - 04/22/15 1115    PT SHORT TERM GOAL #3   Title ambulate > 150' without device independently for improved strength and mobility. (05/02/15)   Status Achieved           PT Long Term Goals - 04/15/15 1057    PT LONG TERM GOAL #1   Title independent with advanced HEP (05/30/15)   Status Partially Met   PT LONG TERM GOAL #2   Title improve L knee AROM 0-115 for improved function and mobility (05/30/15)   Status On-going    PT LONG TERM GOAL #4   Title report return to water aerobics for improved function and strength (05/30/15)   Status Achieved               Plan - 04/22/15 1113    Clinical Impression Statement Lacks ~ 10* extension ROM.  Good flexion gains with PROM.  LAcks confudence with single leg stance/ balance.     Pt will benefit from skilled therapeutic intervention in order to improve on the following deficits Abnormal gait;Decreased range of motion;Difficulty walking;Impaired flexibility;Decreased activity tolerance;Pain;Decreased strength;Decreased balance   Rehab Potential Good   PT Treatment/Interventions ADLs/Self Care Home Management;Electrical Stimulation;Cryotherapy;Functional mobility training;Neuromuscular re-education;Stair training;Manual techniques;Balance training;Gait training;Ultrasound;DME Instruction;Therapeutic exercise;Passive range of motion;Scar mobilization;Patient/family education;Therapeutic activities;Moist Heat   PT Next Visit Plan continue to work on ROM, balance        Problem List Patient Active Problem List   Diagnosis Date Noted   Osteoarthritis of left knee 03/18/2015   Hypothyroidism 03/18/2015   Acute blood loss anemia 06/02/2014   Unspecified hypothyroidism 06/01/2014   Pure hypercholesterolemia 06/01/2014   Constipation 06/01/2014   OA (osteoarthritis) of knee 05/24/2014    Olean Ree, PTA 04/22/2015, 11:29 AM  [place] [address] [place], [place], [address] Phone: [phone]   Fax:  [phone]

## 2015-04-25 ENCOUNTER — Ambulatory Visit: Payer: Medicare Other | Admitting: Physical Therapy

## 2015-04-25 DIAGNOSIS — M25562 Pain in left knee: Secondary | ICD-10-CM

## 2015-04-25 DIAGNOSIS — M25662 Stiffness of left knee, not elsewhere classified: Secondary | ICD-10-CM

## 2015-04-25 DIAGNOSIS — M542 Cervicalgia: Secondary | ICD-10-CM | POA: Diagnosis not present

## 2015-04-25 DIAGNOSIS — R29898 Other symptoms and signs involving the musculoskeletal system: Secondary | ICD-10-CM

## 2015-04-25 NOTE — Therapy (Addendum)
[place], [place], [address] Phone: [phone]   Fax:  [phone]  Physical Therapy Treatment  Patient Details  Name: Lori Meyers MRN: [id number] Date of Birth: [birth year] Referring Provider:  Gaynelle Arabian, MD  Encounter Date: 04/25/2015      PT End of Session - 04/25/15 1101    Visit Number 10   Date for PT Re-Evaluation 06/03/15   PT Start Time 1448   PT Stop Time 1113   PT Time Calculation (min) 58 min   Activity Tolerance Patient tolerated treatment well   Behavior During Therapy [facility] for tasks assessed/performed      Past Medical History  Diagnosis Date  . Hypercholesteremia     under control  . Hypothyroidism     thyroidectomy-under control  . Arthritis     arms, knees  . Bursitis of left knee     Past Surgical History  Procedure Laterality Date  . Thyroidectomy  1990    "zapped"  . Finger surgery  ~2005    joint replaced on right hand middle finger  . 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 Tonsillectomy  as child  . Abdominal hysterectomy  1995    with bladder tac  . Appendectomy  5th grade  . Meniscus repair Left 2007    at office  . 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 Bilateral salpingoophorectomy    . Joint replacement    . Total knee arthroplasty Left 03/14/2015    Procedure: TOTAL LEFT KNEE ARTHROPLASTY;  Surgeon: Gaynelle Arabian, MD;  Location: [facility];  Service: Orthopedics;  Laterality: Left;    There were no vitals filed for this visit.  Visit Diagnosis:  Decreased range of motion of left knee  Left knee pain  Weakness of left lower extremity      Subjective Assessment - 04/25/15 1016    Subjective Knee feels "ok."  Sees MD tomorrow.  Pain was up to 7/10 at night.   Pertinent  History R TKA   How long can you stand comfortably? 15 min   How long can you walk comfortably? 15 min   Patient Stated Goals improve ROM; return to water aerobics and walking   Currently in Pain? Yes   Pain Score 3    Pain Location Knee   Pain Orientation Left   Pain Descriptors / Indicators Aching   Pain Type Surgical pain   Pain Onset More than a month ago   Pain Frequency Constant            [facility] PT Assessment - 04/25/15 1052    Observation/Other Assessments   Focus on Therapeutic Outcomes (FOTO)  58% limited   AROM   Left Knee Extension 5   Left Knee Flexion 107   PROM   Left Knee Extension 3   Left Knee Flexion 113                     [facility] Adult PT Treatment/Exercise - 04/25/15 1020    Knee/Hip Exercises: Aerobic   Stationary Bike x 6 min seat 5 to 3 full revolutions   Elliptical NuStep L6 x 6 min   Knee/Hip Exercises: Machines for Strengthening   Cybex Knee Extension 10#  3x10  Cybex Knee Flexion 25# 3x 10   Cybex Leg Press 40# 3x10   Modalities   Modalities Cryotherapy;Electrical Stimulation   Cryotherapy   Number Minutes Cryotherapy 15 Minutes   Cryotherapy Location Knee   Type of Cryotherapy Ice pack   Electrical Stimulation   Electrical Stimulation Location Lt Knee   Electrical Stimulation Action IFC   Electrical Stimulation Parameters to tolerance x 15 min   Electrical Stimulation Goals Edema;Pain                  PT Short Term Goals - 04-27-2015 1059    PT SHORT TERM GOAL #1   Title independent with HEP (05/02/15)   Status On-going   PT SHORT TERM GOAL #2   Title improve L knee AROM 3-110 for improved mobility and function (05/02/15)   Status On-going   PT SHORT TERM GOAL #3   Title ambulate > 150' without device independently for improved strength and mobility. (05/02/15)   Status Achieved           PT Long Term Goals - 04/27/15 1059    PT LONG TERM GOAL #1   Title independent with advanced HEP (05/30/15)   Status  Partially Met   PT LONG TERM GOAL #2   Title improve L knee AROM 0-115 for improved function and mobility (05/30/15)   Status On-going   PT LONG TERM GOAL #3   Title ambulate without device without increase in pain 100% of the time (05/30/15)   Status On-going   PT LONG TERM GOAL #4   Title report return to water aerobics for improved function and strength (05/30/15)   Status Achieved               Plan - 27-Apr-2015 1058    Clinical Impression Statement ROM improving, however needs continued therapy to progress ROM and strength.  Overall progressing well.  Pain continues to be present along medial joint line with increased pain with flexion.   PT Next Visit Plan continue to work on ROM, balance   Consulted and Agree with Plan of Care Patient          G-Codes - 04/27/2015 1100    Functional Assessment Tool Used FOTO 58% limited   Functional Limitation Mobility: Walking and moving around   Mobility: Walking and Moving Around Current Status (E0923) At least 40 percent but less than 60 percent impaired, limited or restricted   Mobility: Walking and Moving Around Goal Status [phone]) At least 20 percent but less than 40 percent impaired, limited or restricted      Problem List Patient Active Problem List   Diagnosis Date Noted  . Osteoarthritis of left knee 03/18/2015  . Hypothyroidism 03/18/2015  . Acute blood loss anemia 06/02/2014  . Unspecified hypothyroidism 06/01/2014  . Pure hypercholesterolemia 06/01/2014  . Constipation 06/01/2014  . OA (osteoarthritis) of knee 05/24/2014   Laureen Abrahams, PT, DPT 2015/04/27 11:06 AM  [place], [place], [address] Phone: [phone]   Fax:  [phone]

## 2015-04-27 ENCOUNTER — Encounter: Payer: Self-pay | Admitting: Physical Therapy

## 2015-04-27 ENCOUNTER — Ambulatory Visit: Payer: Medicare Other | Admitting: Physical Therapy

## 2015-04-27 DIAGNOSIS — M542 Cervicalgia: Secondary | ICD-10-CM | POA: Diagnosis not present

## 2015-04-27 DIAGNOSIS — M25562 Pain in left knee: Secondary | ICD-10-CM

## 2015-04-27 DIAGNOSIS — M25662 Stiffness of left knee, not elsewhere classified: Secondary | ICD-10-CM

## 2015-04-27 NOTE — Therapy (Signed)
[place], [place], [address] Phone: [phone]   Fax:  [phone]  Physical Therapy Treatment  Patient Details  Name: Lori Meyers MRN: [id number] Date of Birth: 1941-03-18 Referring Provider:  Gaynelle Arabian, MD  Encounter Date: 04/27/2015      PT End of Session - 04/27/15 1054    Visit Number 11   PT Start Time 1010   PT Stop Time 1108   PT Time Calculation (min) 58 min      Past Medical History  Diagnosis Date  . Hypercholesteremia     under control  . Hypothyroidism     thyroidectomy-under control  . Arthritis     arms, knees  . Bursitis of left knee     Past Surgical History  Procedure Laterality Date  . Thyroidectomy  1990    "zapped"  . Finger surgery  ~2005    joint replaced on right hand middle finger  . 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 Tonsillectomy  as child  . Abdominal hysterectomy  1995    with bladder tac  . Appendectomy  5th grade  . Meniscus repair Left 2007    at office  . 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 Bilateral salpingoophorectomy    . Joint replacement    . Total knee arthroplasty Left 03/14/2015    Procedure: TOTAL LEFT KNEE ARTHROPLASTY;  Surgeon: Gaynelle Arabian, MD;  Location: [facility];  Service: Orthopedics;  Laterality: Left;    There were no vitals filed for this visit.  Visit Diagnosis:  Decreased range of motion of left knee  Left knee pain      Subjective Assessment - 04/27/15 1011    Subjective MD pleased, feels that I can stop PT and go to the "Y"   Currently in Pain? Yes   Pain Score 2    Pain Location Knee   Pain Orientation Left   Pain Descriptors / Indicators Aching;Discomfort   Pain Type Surgical pain                         [facility]  Adult PT Treatment/Exercise - 04/27/15 0001    Knee/Hip Exercises: Stretches   Passive Hamstring Stretch 60 seconds;4 reps   Passive Hamstring Stretch Limitations 7.5# on top of the knee for LLLD stretch   Knee: Self-Stretch to increase Flexion 60 seconds;2 reps   Knee/Hip Exercises: Aerobic   Stationary Bike x 6 min seat 5 to 3 full revolutions   Elliptical NuStep L6 x 6 min   Tread Mill Elliptical x 1 minute   Knee/Hip Exercises: Machines for Strengthening   Cybex Knee Extension 10#  3x10   Cybex Knee Flexion 25# 3x 10   Cryotherapy   Number Minutes Cryotherapy 15 Minutes   Cryotherapy Location Knee   Type of Cryotherapy Ice pack   Electrical Stimulation   Electrical Stimulation Location Lt Knee   Electrical Stimulation Action IFC   Electrical Stimulation Parameters to tolerance   Electrical Stimulation Goals Edema;Pain                  PT Short Term Goals - 04/25/15 1059    PT SHORT TERM GOAL #1   Title independent with HEP (05/02/15)  Status On-going   PT SHORT TERM GOAL #2   Title improve L knee AROM 3-110 for improved mobility and function (05/02/15)   Status On-going   PT SHORT TERM GOAL #3   Title ambulate > 150' without device independently for improved strength and mobility. (05/02/15)   Status Achieved           PT Long Term Goals - 04/25/15 1059    PT LONG TERM GOAL #1   Title independent with advanced HEP (05/30/15)   Status Partially Met   PT LONG TERM GOAL #2   Title improve L knee AROM 0-115 for improved function and mobility (05/30/15)   Status On-going   PT LONG TERM GOAL #3   Title ambulate without device without increase in pain 100% of the time (05/30/15)   Status On-going   PT LONG TERM GOAL #4   Title report return to water aerobics for improved function and strength (05/30/15)   Status Achieved               Plan - 04/27/15 1054    Clinical Impression Statement Went over today gym safety and use of equipment, has a good  understanding but is fearful and unsure of herself with this   PT Next Visit Plan Go over HEP, RICE and any questions that she has and D/C next visit   Consulted and Agree with Plan of Care Patient        Problem List Patient Active Problem List   Diagnosis Date Noted  . Osteoarthritis of left knee 03/18/2015  . Hypothyroidism 03/18/2015  . Acute blood loss anemia 06/02/2014  . Unspecified hypothyroidism 06/01/2014  . Pure hypercholesterolemia 06/01/2014  . Constipation 06/01/2014  . OA (osteoarthritis) of knee 05/24/2014    Sumner Boast., PT 04/27/2015, 10:59 AM  [place] [address] [place], [place], [address] Phone: [phone]   Fax:  [phone]

## 2015-04-29 ENCOUNTER — Ambulatory Visit: Payer: Medicare Other | Admitting: Physical Therapy

## 2015-04-29 ENCOUNTER — Encounter: Payer: Self-pay | Admitting: Physical Therapy

## 2015-04-29 DIAGNOSIS — R29898 Other symptoms and signs involving the musculoskeletal system: Secondary | ICD-10-CM

## 2015-04-29 DIAGNOSIS — M25662 Stiffness of left knee, not elsewhere classified: Secondary | ICD-10-CM

## 2015-04-29 DIAGNOSIS — M542 Cervicalgia: Secondary | ICD-10-CM | POA: Diagnosis not present

## 2015-04-29 NOTE — Therapy (Signed)
[place], [place], [address] Phone: [phone]   Fax:  [phone]  Physical Therapy Treatment  Patient Details  Name: Lori Meyers MRN: [id number] Date of Birth: 1941/04/25 Referring Provider:  Gaynelle Arabian, MD  Encounter Date: 04/29/2015      PT End of Session - 04/29/15 1043    Visit Number 12   PT Start Time 1010   PT Stop Time 3532   PT Time Calculation (min) 55 min      Past Medical History  Diagnosis Date  . Hypercholesteremia     under control  . Hypothyroidism     thyroidectomy-under control  . Arthritis     arms, knees  . Bursitis of left knee     Past Surgical History  Procedure Laterality Date  . Thyroidectomy  1990    "zapped"  . Finger surgery  ~2005    joint replaced on right hand middle finger  . 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 Tonsillectomy  as child  . Abdominal hysterectomy  1995    with bladder tac  . Appendectomy  5th grade  . Meniscus repair Left 2007    at office  . 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 Bilateral salpingoophorectomy    . Joint replacement    . Total knee arthroplasty Left 03/14/2015    Procedure: TOTAL LEFT KNEE ARTHROPLASTY;  Surgeon: Gaynelle Arabian, MD;  Location: [facility];  Service: Orthopedics;  Laterality: Left;    There were no vitals filed for this visit.  Visit Diagnosis:  Decreased range of motion of left knee  Weakness of left lower extremity      Subjective Assessment - 04/29/15 1022    Subjective medial knee pain at night, ready to get back to water    Currently in Pain? Yes   Pain Score 4    Pain Location Knee   Pain Orientation Left   Pain Descriptors / Indicators Aching            [facility] PT Assessment - 04/29/15 0001    AROM   Left  Knee Extension 4   Left Knee Flexion 108                     [facility] Adult PT Treatment/Exercise - 04/29/15 0001    Knee/Hip Exercises: Aerobic   Stationary Bike x 6 min seat 5 to 3 full revolutions   Elliptical NuStep L6 x 6 min   Knee/Hip Exercises: Machines for Strengthening   Cybex Knee Extension 10#  3x10   Cybex Knee Flexion 25# 3x 10   Cybex Leg Press 40# 3x10  40# calf raises 2 sets 15   Knee/Hip Exercises: Standing   Terminal Knee Extension Strengthening;Left;2 sets;10 reps   Cryotherapy   Number Minutes Cryotherapy 15 Minutes   Cryotherapy Location Knee   Type of Cryotherapy Ice pack   Electrical Stimulation   Electrical Stimulation Location Lt Knee   Electrical Stimulation Action IFC   Electrical Stimulation Goals Edema;Pain                PT Education - 04/29/15 1020    Education provided Yes   Education Details REVIEWED GYM SAFETY, EQUIPMENT AND PROGRESSION   Person(s) Educated Patient  Methods Explanation   Comprehension Verbalized understanding          PT Short Term Goals - 04/29/15 1028    PT SHORT TERM GOAL #2   Title improve L knee AROM 3-110 for improved mobility and function (05/02/15)   Status On-going           PT Long Term Goals - 04/29/15 1028    PT LONG TERM GOAL #2   Title improve L knee AROM 0-115 for improved function and mobility (05/30/15)   Status On-going               Plan - 04/29/15 1046    Clinical Impression Statement goals met, educated for return to gym   PT Next Visit Plan Discharge today        Problem List Patient Active Problem List   Diagnosis Date Noted  . Osteoarthritis of left knee 03/18/2015  . Hypothyroidism 03/18/2015  . Acute blood loss anemia 06/02/2014  . Unspecified hypothyroidism 06/01/2014  . Pure hypercholesterolemia 06/01/2014  . Constipation 06/01/2014  . OA (osteoarthritis) of knee 05/24/2014    Yohance Hathorne,ANGIE PTA Lum Babe PT 04/29/2015, 10:47  AM  [place] [address] [place], [place], [address] Phone: [phone]   Fax:  [phone]

## 2015-05-16 ENCOUNTER — Other Ambulatory Visit: Payer: Self-pay | Admitting: Nurse Practitioner

## 2015-06-08 ENCOUNTER — Other Ambulatory Visit: Payer: Self-pay | Admitting: Nurse Practitioner

## 2020-01-09 ENCOUNTER — Ambulatory Visit: Payer: Medicare Other

## 2020-01-14 ENCOUNTER — Ambulatory Visit: Payer: Medicare Other

## 2020-01-15 ENCOUNTER — Ambulatory Visit: Payer: Medicare PPO | Attending: Internal Medicine

## 2020-01-15 DIAGNOSIS — Z23 Encounter for immunization: Secondary | ICD-10-CM | POA: Insufficient documentation

## 2020-01-15 NOTE — Progress Notes (Signed)
   Covid-19 Vaccination Clinic  Name:  Lori Meyers    MRN: [id number] DOB: 18-Aug-1941  01/15/2020  [name] was observed post Covid-19 immunization for 15 minutes without incidence. She was provided with Vaccine Information Sheet and instruction to access the V-Safe system.   [name] was instructed to call 911 with any severe reactions post vaccine: Marland Kitchen Difficulty breathing  . Swelling of your face and throat  . A fast heartbeat  . A bad rash all over your body  . Dizziness and weakness    Immunizations Administered    Name Date Dose VIS Date Route   Pfizer COVID-19 Vaccine 01/15/2020 10:53 AM 0.3 mL 11/20/2019 Intramuscular   Manufacturer: [employer], [employer]   Lot: [id number]   NDC: [address]-8

## 2020-02-09 ENCOUNTER — Ambulatory Visit: Payer: Medicare PPO | Attending: Internal Medicine

## 2020-02-09 DIAGNOSIS — Z23 Encounter for immunization: Secondary | ICD-10-CM | POA: Insufficient documentation

## 2020-02-09 NOTE — Progress Notes (Signed)
   Covid-19 Vaccination Clinic  Name:  JAZSMIN COUSE    MRN: [id number] DOB: 20-Mar-1941  02/09/2020  Ms. Tippens was observed post Covid-19 immunization for 15 minutes without incident. She was provided with Vaccine Information Sheet and instruction to access the V-Safe system.   Ms. Laird was instructed to call 911 with any severe reactions post vaccine: Marland Kitchen Difficulty breathing  . Swelling of face and throat  . A fast heartbeat  . A bad rash all over body  . Dizziness and weakness   Immunizations Administered    Name Date Dose VIS Date Route   Pfizer COVID-19 Vaccine 02/09/2020 11:42 AM 0.3 mL 11/20/2019 Intramuscular   Manufacturer: [employer], [employer]   Lot: [id number]   NDC: [address]-8

## 2020-07-25 ENCOUNTER — Other Ambulatory Visit: Payer: Self-pay | Admitting: Internal Medicine

## 2020-07-25 DIAGNOSIS — E782 Mixed hyperlipidemia: Secondary | ICD-10-CM

## 2020-08-09 ENCOUNTER — Ambulatory Visit
Admission: RE | Admit: 2020-08-09 | Discharge: 2020-08-09 | Disposition: A | Discharge status: Home / Self Care | Payer: No Typology Code available for payment source | Source: Ambulatory Visit | Attending: Internal Medicine | Admitting: Internal Medicine

## 2020-08-09 DIAGNOSIS — E782 Mixed hyperlipidemia: Secondary | ICD-10-CM

## 2020-08-10 ENCOUNTER — Encounter: Payer: Self-pay | Admitting: Neurology

## 2020-08-10 ENCOUNTER — Ambulatory Visit: Payer: Medicare PPO | Admitting: Neurology

## 2020-08-10 VITALS — BP 145/85 | HR 79 | Ht 63.5 in | Wt 179.0 lb

## 2020-08-10 DIAGNOSIS — Z9229 Personal history of other drug therapy: Secondary | ICD-10-CM | POA: Diagnosis not present

## 2020-08-10 DIAGNOSIS — R0683 Snoring: Secondary | ICD-10-CM | POA: Diagnosis not present

## 2020-08-10 DIAGNOSIS — E6609 Other obesity due to excess calories: Secondary | ICD-10-CM

## 2020-08-10 DIAGNOSIS — E039 Hypothyroidism, unspecified: Secondary | ICD-10-CM

## 2020-08-10 DIAGNOSIS — Z6832 Body mass index (BMI) 32.0-32.9, adult: Secondary | ICD-10-CM

## 2020-08-10 NOTE — Progress Notes (Signed)
SLEEP MEDICINE CLINIC    Provider:  Melvyn Novas, MD  Primary Care Physician:  Rodrigo Ran, MD [address] [place] [address]     Referring Provider: Rodrigo Ran, Md [address] [place],  [place] [address]          Chief Complaint according to patient   Patient presents with:    . New Patient (Initial Visit)     11. pt here because PCP recommended she rule out OSA. he advised that her that throat was narrow. otherwise the patient doesnt complain of concerns related to her sleep. sometimes she has daytime fatigue. never had a SS.      HISTORY OF PRESENT ILLNESS:  Lori Meyers is a 79 year old Caucasian female patient and seen here upon referral on 08/10/2020  for a sleep evaluation  Chief concern according to patient : " I have Grave's disease and attribute to this condition , also my weight".    I have the pleasure of seeing Lori Meyers today, a right-handed Caucasian female with a possible sleep disorder.  She   has a past medical history of Arthritis, Bursitis of left knee, Hypercholesteremia, and Hypothyroidism. The patient had no previous sleep study.     Family medical /sleep history: younger brother was a heavy smoker had OSA and COPD, CAD- no other family member on CPAP with OSA, insomnia, sleep walkers.    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 and lives in a household with spouse, married 59 years. Pets are not present. Tobacco use-never .  ETOH use ; none , Caffeine intake in form of Tea ( 2/ day ) or energy drinks. Regular exercise in form of walking, water fitness .     Sleep habits are as follows: The patient's dinner time is between 5-6 PM. The patient goes to bed at 10 PM and continues to sleep for 5 hours, wakes for one bathroom break, the first time at 3 AM.   The preferred sleep position is left side, with the support of 1pillow. Dreams are reportedly rare.  She has hip  pain.  6 AM is the usual rise time. The patient wakes up spontaneously. She reports  Feeling/ refreshed /restored in AM.  Naps are taken infrequently, lasting from 20-30 minutes and are refreshing as nocturnal sleep, recliner, reading, snoozing. .    Review of Systems: Out of a complete 14 system review, the patient complains of only the following symptoms, and all other reviewed systems are negative.:  Fatigue. Husbands stated she snores.    How likely are you to doze in the following situations: 0 = not likely, 1 = slight chance, 2 = moderate chance, 3 = high chance   Sitting and Reading? Watching Television? Sitting inactive in a public place (theater or meeting)? As a passenger in a car for an hour without a break? Lying down in the afternoon when circumstances permit? Sitting and talking to someone? Sitting quietly after lunch without alcohol? In a car, while stopped for a few minutes in traffic?   Total = 6/ 24 points   FSS endorsed at 19/ 63 points.   Social History   Socioeconomic History  . Marital status: Married    Spouse name: Not on file  . Number of children: Not on file  . Years of education: Not on file  . Highest education level: Not on file  Occupational  History  . Not on file  Tobacco Use  . Smoking status: Never Smoker  . Smokeless tobacco: Never Used  Substance and Sexual Activity  . Alcohol use: No  . Drug use: No  . Sexual activity: Not on file  Other Topics Concern  . Not on file  Social History Narrative  . Not on file   Social Determinants of Health   Financial Resource Strain:   . Difficulty of Paying Living Expenses: Not on file  Food Insecurity:   . Worried About Programme researcher, broadcasting/film/video in the Last Year: Not on file  . Ran Out of Food in the Last Year: Not on file  Transportation Needs:   . Lack of Transportation (Medical): Not on file  . Lack of Transportation (Non-Medical): Not on file  Physical Activity:   . Days of Exercise per  Week: Not on file  . Minutes of Exercise per Session: Not on file  Stress:   . Feeling of Stress : Not on file  Social Connections:   . Frequency of Communication with Friends and Family: Not on file  . Frequency of Social Gatherings with Friends and Family: Not on file  . Attends Religious Services: Not on file  . Active Member of Clubs or Organizations: Not on file  . Attends Banker Meetings: Not on file  . Marital Status: Not on file    No family history on file.  Past Medical History:  Diagnosis Date  . Arthritis    arms, knees  . Bursitis of left knee   . Hypercholesteremia    under control  . Hypothyroidism    thyroidectomy-under control    Past Surgical History:  Procedure Laterality Date  . ABDOMINAL HYSTERECTOMY  1995   with bladder tac  . APPENDECTOMY  5th grade  . BILATERAL SALPINGOOPHORECTOMY    . FINGER SURGERY  ~2005   joint replaced on right hand middle finger  . JOINT REPLACEMENT    . MENISCUS REPAIR Left 2007   at office  . SKIN CANCER DESTRUCTION     "pre-cancerous" on back of leg  . THYROIDECTOMY  1990   "zapped"  . TONSILLECTOMY  as child  . TOTAL KNEE ARTHROPLASTY Right 05/24/2014   Procedure: RIGHT TOTAL KNEE ARTHROPLASTY;  Surgeon: Loanne Drilling, MD;  Location: [facility];  Service: Orthopedics;  Laterality: Right;  . TOTAL KNEE ARTHROPLASTY Left 03/14/2015   Procedure: TOTAL LEFT KNEE ARTHROPLASTY;  Surgeon: Ollen Gross, MD;  Location: [facility];  Service: Orthopedics;  Laterality: Left;     Current Outpatient Medications on File Prior to Visit  Medication Sig Dispense Refill  . acetaminophen (TYLENOL) 325 MG tablet Take 2 tablets (650 mg total) by mouth every 6 (six) hours as needed for mild pain (or Fever >/= 101). 40 tablet 0  . atorvastatin (LIPITOR) 80 MG tablet Take 80 mg by mouth every evening.     . CELEBREX 200 MG capsule     . ergocalciferol (VITAMIN D2) 1.25 MG (50000 UT) capsule once a week.    . ezetimibe (ZETIA) 10 MG  tablet Take 10 mg by mouth every morning.    Marland Kitchen levothyroxine (SYNTHROID, LEVOTHROID) 50 MCG tablet Take 75-100 mcg by mouth daily before breakfast. Takes 2 tablets on Monday Wednesday and Friday and 1 and 1/2 the rest of the week    . liothyronine (CYTOMEL) 5 MCG tablet Take 5 mcg by mouth 2 (two) times daily.    . Multiple Vitamin (MULTIVITAMIN)  capsule Take 1 capsule by mouth daily.     No current facility-administered medications on file prior to visit.    Allergies  Allergen Reactions  . Niacin And Related     Passed out    Physical exam:  Today's Vitals   08/10/20 0843  BP: (!) 145/85  Pulse: 79  Weight: 179 lb (81.2 kg)  Height: 5' 3.5" (1.613 m)   Body mass index is 31.21 kg/m.   Wt Readings from Last 3 Encounters:  08/10/20 179 lb (81.2 kg)  03/30/15 171 lb 12.8 oz (77.9 kg)  03/22/15 175 lb (79.4 kg)     Ht Readings from Last 3 Encounters:  08/10/20 5' 3.5" (1.613 m)  03/30/15 5\' 3"  (1.6 m)  03/18/15 5\' 3"  (1.6 m)      General: The patient is awake, alert and appears not in acute distress. The patient is well groomed. Head: Normocephalic, atraumatic. Neck is supple. Mallampati 3,  neck circumference: 16 inches . Nasal airflow patent.  Retrognathia is seen.  Dental status: crowded.  Cardiovascular:  Regular rate and cardiac rhythm by pulse,  without distended neck veins. Respiratory: Lungs are clear to auscultation.  Skin:  Without evidence of ankle edema, or rash. Trunk: The patient's posture is erect.   Neurologic exam : The patient is awake and alert, oriented to place and time.   Memory subjective described as intact.  Attention span & concentration ability appears normal.  Speech is fluent,  without  dysarthria, dysphonia or aphasia.  Mood and affect are appropriate.   Cranial nerves: no loss of smell or taste reported  Pupils are equal and briskly reactive to light. Funduscopic exam deferred.  Extraocular movements in vertical and horizontal  planes were intact and without nystagmus. No Diplopia. Visual fields by finger perimetry are intact. Hearing was intact to soft voice and finger rubbing.    Facial sensation intact to fine touch.  Facial motor strength is symmetric and tongue and uvula move midline.  Neck ROM : rotation, tilt and flexion extension were normal for age and shoulder shrug was symmetrical.    Motor exam:  Symmetric bulk, tone and ROM.   Normal tone without cogwheeling, symmetric grip strength .   Sensory:  Fine touch and vibration were normal.  Proprioception tested in the upper extremities was normal.   Coordination: Rapid alternating movements in the fingers/hands were of normal speed.  The Finger-to-nose maneuver was intact without evidence of ataxia, dysmetria or tremor.   Gait and station: Patient could rise unassisted from a seated position, walked without assistive device.  Stance is of normal width/ base and the patient turned with 3 steps.  Toe and heel walk were deferred.  Deep tendon reflexes: in the upper and lower extremities are symmetric and intact.  Babinski response was deferred .       After spending a total time of 45 minutes face to face and additional time for physical and neurologic examination, review of laboratory studies,  personal review of imaging studies, reports and results of other testing and review of referral information / records as far as provided in visit, I have established the following assessments:  1) Patient with a narrow airway and crowded dentition, lower jaw. Has been snoring and gained weight related to Graves/ disease. BMI is 31- neck is larger than average. All OSA risk factors.     My Plan is to proceed with:  1) HST or PSG. Screening for Sleep apnea.    I  would like to thank Rodrigo Ran, MD and Rodrigo Ran, Md [address] [place],  [place] [address] for allowing me to meet with and to take care of this pleasant patient.   I plan to follow up either  personally or through our NP within 2-3  month.   CC: I will share my notes with PCP, Dr. Waynard Edwards.   Electronically signed by: Melvyn Novas, MD 08/10/2020 9:00 AM  [facility] Board certified by The [employer] of Sleep Medicine and Diplomate of the [employer] of Sleep Medicine. Board certified In Neurology through the ABPN, Fellow of the [employer] of Neurology. Medical Director of [facility].

## 2020-08-10 NOTE — Patient Instructions (Signed)

## 2020-09-05 ENCOUNTER — Ambulatory Visit (INDEPENDENT_AMBULATORY_CARE_PROVIDER_SITE_OTHER): Payer: Medicare PPO | Admitting: Neurology

## 2020-09-05 DIAGNOSIS — G4733 Obstructive sleep apnea (adult) (pediatric): Secondary | ICD-10-CM

## 2020-09-05 DIAGNOSIS — R0683 Snoring: Secondary | ICD-10-CM

## 2020-09-05 DIAGNOSIS — Z6832 Body mass index (BMI) 32.0-32.9, adult: Secondary | ICD-10-CM

## 2020-09-05 DIAGNOSIS — E039 Hypothyroidism, unspecified: Secondary | ICD-10-CM

## 2020-09-13 DIAGNOSIS — E6609 Other obesity due to excess calories: Secondary | ICD-10-CM | POA: Insufficient documentation

## 2020-09-13 DIAGNOSIS — G4733 Obstructive sleep apnea (adult) (pediatric): Secondary | ICD-10-CM | POA: Insufficient documentation

## 2020-09-13 DIAGNOSIS — R0683 Snoring: Secondary | ICD-10-CM | POA: Insufficient documentation

## 2020-09-13 NOTE — Procedures (Signed)
Sleep Study Report   Patient Information     First Name: Lori Last Name: Meyers ID: [id number]  Birth Date: August 20, 2041 Age: 79 Gender: Female  Referring Provider: Rodrigo Ran, MD BMI: 30.5 (W=178 lb, H=5' 4'')  Neck Circ.:  16 '' Epworth:  6/24   Sleep Study Information    Study Date: 09/05/20 S/H/A Version: [id number] / 4.2.1023 / 79  History:    Lori Meyers is a 79 year old Caucasian female patient and seen here upon referral for a sleep consultation on 08/10/2020.  Chief concern according to patient: " I have Grave's disease and attribute my sleep problems to this condition, also my weight". Lori Meyers is a right-handed Caucasian female with a possible sleep disorder.  She has a medical history of Arthritis, Bursitis of left knee, Hypercholesteremia, weight gain and Hypothyroidism. The patient had no previous sleep study.    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    Recommendations:     REM depend sleep apnea is not responding to dental devices or inspire technology, and best treated with positive airway pressure therapy, " PAP".  I will order a CPAP autotitration device for this patient with a setting from 5-14 cm water, 1 cm EPR and her choice of mask and degree of humidification.   Interpreting Physician: Melvyn Novas, MD            Sleep Summary  Oxygen Saturation Statistics   Start Study Time: End Study Time: Total Recording Time:  9:58:32 PM 6:10:55 AM   8 h, 12 min  Total Sleep Time % REM of Sleep Time:  7 h, 31 min  24.1    Mean: 92 Minimum: 87 Maximum: 97  Mean of Desaturations Nadirs (%):   90  Oxygen Desaturation. %: 4-9 10-20 >20 Total  Events Number Total  16 100.0  0 0.0  0 0.0  16 100.0  Oxygen Saturation: <90 <=88 <85 <80 <70  Duration (minutes): Sleep % 0.6 0.1 0.1 0.0 0.0 0.0 0.0 0.0 0.0 0.0       Respiratory Indices      Total Events REM NREM All Night  pRDI: pAHI 3%: ODI 4%: pAHIc 3%: % CSR: pAHI 4%:  55  49  16  0 0.0 17 22.6 22.0 7.7 0.0 2.5 1.6 0.4 0.0 7.3 6.5 2.1 0.0 2.3       Pulse Rate Statistics during Sleep (BPM)      Mean: 68 Minimum: 56 Maximum: 89    Indices are calculated using technically valid sleep time of 7 h,         5              15                    30           pAHI=6.5                                                         Mild              Moderate  Severe

## 2020-09-13 NOTE — Addendum Note (Signed)
Addended by: Melvyn Novas on: 09/13/2020 04:19 PM   Modules accepted: Orders

## 2020-09-13 NOTE — Progress Notes (Signed)
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    Recommendations:    REM depend sleep apnea is not responding to dental devices or  inspire technology, and best treated with positive airway  pressure therapy, " PAP".  I will order a CPAP autotitration device for this patient with a  setting from 5-14 cm water, 1 cm EPR and her choice of mask and  degree of humidification.   Interpreting Physician: Melvyn Novas, MD

## 2020-09-14 ENCOUNTER — Telehealth: Payer: Self-pay | Admitting: Neurology

## 2020-09-14 NOTE — Telephone Encounter (Signed)
Called the patient and reviewed the sleep study results. Advised that there was overall mild sleep apnea and REM dependent apnea present. Advised heart rate and oxygen level overall did well. Advised that Dr Vickey Huger recommends the auto CPAP machine. Educated the patient on the machine and how it works. Patient has requested the sleep study be mailed to her along with whatever information we have. Advised that if she decides to move forward with treatment she would need to contact our office back. Pt verbalized understanding. Pt had no questions at this time but was encouraged to call back if questions arise.

## 2020-09-14 NOTE — Telephone Encounter (Signed)
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    Recommendations:    REM depend sleep apnea is not responding to dental devices or  inspire technology, and best treated with positive airway  pressure therapy, " PAP".  I will order a CPAP autotitration device for this patient with a  setting from 5-14 cm water, 1 cm EPR and her choice of mask and  degree of humidification.   Interpreting Physician: Melvyn Novas, MD

## 2020-09-17 DIAGNOSIS — Z23 Encounter for immunization: Secondary | ICD-10-CM | POA: Diagnosis not present

## 2020-10-13 ENCOUNTER — Telehealth: Payer: Self-pay | Admitting: Neurology

## 2020-10-13 NOTE — Telephone Encounter (Signed)
I have the contacted the patient and advised of the machine and what all she would have to do. Informed the patient that I would send the order to a company. [employer] ([employer] Informed they will contact her and discuss financials and educate on how to use the machine and schedule apt to get set up. Pt verbalized understanding. Order has been sent

## 2020-10-13 NOTE — Telephone Encounter (Signed)
Pt has called stating she is ready to move forward with getting set up with a cpap machine. Also, pt has some questions and is requesting a call back.

## 2020-10-18 DIAGNOSIS — L565 Disseminated superficial actinic porokeratosis (DSAP): Secondary | ICD-10-CM | POA: Diagnosis not present

## 2020-11-01 DIAGNOSIS — Z86010 Personal history of colonic polyps: Secondary | ICD-10-CM | POA: Diagnosis not present

## 2020-11-01 DIAGNOSIS — D123 Benign neoplasm of transverse colon: Secondary | ICD-10-CM | POA: Diagnosis not present

## 2020-11-01 DIAGNOSIS — D122 Benign neoplasm of ascending colon: Secondary | ICD-10-CM | POA: Diagnosis not present

## 2020-11-01 DIAGNOSIS — Z1211 Encounter for screening for malignant neoplasm of colon: Secondary | ICD-10-CM | POA: Diagnosis not present

## 2020-11-07 DIAGNOSIS — D123 Benign neoplasm of transverse colon: Secondary | ICD-10-CM | POA: Diagnosis not present

## 2020-11-07 DIAGNOSIS — D122 Benign neoplasm of ascending colon: Secondary | ICD-10-CM | POA: Diagnosis not present

## 2020-11-07 DIAGNOSIS — K635 Polyp of colon: Secondary | ICD-10-CM | POA: Diagnosis not present

## 2020-11-07 DIAGNOSIS — Z86010 Personal history of colonic polyps: Secondary | ICD-10-CM | POA: Diagnosis not present

## 2021-02-28 DIAGNOSIS — M8589 Other specified disorders of bone density and structure, multiple sites: Secondary | ICD-10-CM | POA: Diagnosis not present

## 2021-02-28 DIAGNOSIS — E782 Mixed hyperlipidemia: Secondary | ICD-10-CM | POA: Diagnosis not present

## 2021-02-28 DIAGNOSIS — E039 Hypothyroidism, unspecified: Secondary | ICD-10-CM | POA: Diagnosis not present

## 2021-03-14 DIAGNOSIS — L821 Other seborrheic keratosis: Secondary | ICD-10-CM | POA: Diagnosis not present

## 2021-03-14 DIAGNOSIS — Z86008 Personal history of in-situ neoplasm of other site: Secondary | ICD-10-CM | POA: Diagnosis not present

## 2021-03-14 DIAGNOSIS — B078 Other viral warts: Secondary | ICD-10-CM | POA: Diagnosis not present

## 2021-03-14 DIAGNOSIS — G548 Other nerve root and plexus disorders: Secondary | ICD-10-CM | POA: Diagnosis not present

## 2021-03-14 DIAGNOSIS — L738 Other specified follicular disorders: Secondary | ICD-10-CM | POA: Diagnosis not present

## 2021-03-14 DIAGNOSIS — D1801 Hemangioma of skin and subcutaneous tissue: Secondary | ICD-10-CM | POA: Diagnosis not present

## 2021-03-16 DIAGNOSIS — Z961 Presence of intraocular lens: Secondary | ICD-10-CM | POA: Diagnosis not present

## 2021-03-16 DIAGNOSIS — E0500 Thyrotoxicosis with diffuse goiter without thyrotoxic crisis or storm: Secondary | ICD-10-CM | POA: Diagnosis not present

## 2021-03-16 DIAGNOSIS — E0501 Thyrotoxicosis with diffuse goiter with thyrotoxic crisis or storm: Secondary | ICD-10-CM | POA: Diagnosis not present

## 2021-03-16 DIAGNOSIS — H43813 Vitreous degeneration, bilateral: Secondary | ICD-10-CM | POA: Diagnosis not present

## 2021-03-21 DIAGNOSIS — M19041 Primary osteoarthritis, right hand: Secondary | ICD-10-CM | POA: Diagnosis not present

## 2021-03-21 DIAGNOSIS — Z791 Long term (current) use of non-steroidal anti-inflammatories (NSAID): Secondary | ICD-10-CM | POA: Diagnosis not present

## 2021-03-21 DIAGNOSIS — M19042 Primary osteoarthritis, left hand: Secondary | ICD-10-CM | POA: Diagnosis not present

## 2021-03-23 ENCOUNTER — Telehealth: Payer: Self-pay | Admitting: Neurology

## 2021-03-23 NOTE — Telephone Encounter (Signed)
Pt has cancelled her CPAP f/u due to having a panic attack when trying to use for the 1st time last night. Pt is asking to be called about other options outside of the CPAP

## 2021-03-23 NOTE — Telephone Encounter (Signed)
Her apnea is mild- and I appreciate that she tried CPAP, it was certainly difficult for her.  I am not endorsing inspire / dental device because of the REM dependent form of apnea.

## 2021-03-23 NOTE — Telephone Encounter (Signed)
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   Do you have another recommendation for treatment for her and would you agree ok for her to DC CPAP.

## 2021-03-23 NOTE — Telephone Encounter (Signed)
Called the pt back to get more information.  Patient states that she started the machine on Monday and did not seem to have any concerns on Monday.  However Tuesday night when she wore the machine she had the worst panic attack that she has had years.  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  Advised the patient that Dr. Vickey Huger is aware of her concern and would highly encouraged to try to continue to get an opportunity for the patient to get used to the machine.  Advised that typically in these situations, Dr Vickey Huger will offer a small amount of xanax 0.25 mg for the pt to try and see if that helps her to get used to it. Pt states that she appreciated everything that we have done but she does not want to continue the CPAP at all. She has already called the company and they are aware she is planning to return the machine. Advised I would note this on my end and informed the pt to call back if she needed anything from [place].

## 2021-04-03 DIAGNOSIS — H903 Sensorineural hearing loss, bilateral: Secondary | ICD-10-CM | POA: Diagnosis not present

## 2021-04-03 DIAGNOSIS — E039 Hypothyroidism, unspecified: Secondary | ICD-10-CM | POA: Diagnosis not present

## 2021-05-15 DIAGNOSIS — E039 Hypothyroidism, unspecified: Secondary | ICD-10-CM | POA: Diagnosis not present

## 2021-05-30 ENCOUNTER — Ambulatory Visit: Payer: Medicare PPO | Admitting: Neurology

## 2021-08-01 DIAGNOSIS — E039 Hypothyroidism, unspecified: Secondary | ICD-10-CM | POA: Diagnosis not present

## 2021-08-03 DIAGNOSIS — Z1231 Encounter for screening mammogram for malignant neoplasm of breast: Secondary | ICD-10-CM | POA: Diagnosis not present

## 2021-08-22 DIAGNOSIS — E782 Mixed hyperlipidemia: Secondary | ICD-10-CM | POA: Diagnosis not present

## 2021-08-22 DIAGNOSIS — E039 Hypothyroidism, unspecified: Secondary | ICD-10-CM | POA: Diagnosis not present

## 2021-08-22 DIAGNOSIS — E559 Vitamin D deficiency, unspecified: Secondary | ICD-10-CM | POA: Diagnosis not present

## 2021-08-22 DIAGNOSIS — R7301 Impaired fasting glucose: Secondary | ICD-10-CM | POA: Diagnosis not present

## 2021-08-30 DIAGNOSIS — E039 Hypothyroidism, unspecified: Secondary | ICD-10-CM | POA: Diagnosis not present

## 2021-08-30 DIAGNOSIS — Z23 Encounter for immunization: Secondary | ICD-10-CM | POA: Diagnosis not present

## 2021-08-30 DIAGNOSIS — I700 Atherosclerosis of aorta: Secondary | ICD-10-CM | POA: Diagnosis not present

## 2021-08-30 DIAGNOSIS — R011 Cardiac murmur, unspecified: Secondary | ICD-10-CM | POA: Diagnosis not present

## 2021-08-30 DIAGNOSIS — Z0000 Encounter for general adult medical examination without abnormal findings: Secondary | ICD-10-CM | POA: Diagnosis not present

## 2021-08-30 DIAGNOSIS — E559 Vitamin D deficiency, unspecified: Secondary | ICD-10-CM | POA: Diagnosis not present

## 2021-08-30 DIAGNOSIS — M8580 Other specified disorders of bone density and structure, unspecified site: Secondary | ICD-10-CM | POA: Diagnosis not present

## 2021-08-30 DIAGNOSIS — R7301 Impaired fasting glucose: Secondary | ICD-10-CM | POA: Diagnosis not present

## 2021-08-30 DIAGNOSIS — E782 Mixed hyperlipidemia: Secondary | ICD-10-CM | POA: Diagnosis not present

## 2021-09-28 DIAGNOSIS — H02831 Dermatochalasis of right upper eyelid: Secondary | ICD-10-CM | POA: Diagnosis not present

## 2021-09-28 DIAGNOSIS — H43813 Vitreous degeneration, bilateral: Secondary | ICD-10-CM | POA: Diagnosis not present

## 2021-09-28 DIAGNOSIS — Z86008 Personal history of in-situ neoplasm of other site: Secondary | ICD-10-CM | POA: Diagnosis not present

## 2021-09-28 DIAGNOSIS — L821 Other seborrheic keratosis: Secondary | ICD-10-CM | POA: Diagnosis not present

## 2021-09-28 DIAGNOSIS — L720 Epidermal cyst: Secondary | ICD-10-CM | POA: Diagnosis not present

## 2021-09-28 DIAGNOSIS — Z961 Presence of intraocular lens: Secondary | ICD-10-CM | POA: Diagnosis not present

## 2021-09-28 DIAGNOSIS — D1801 Hemangioma of skin and subcutaneous tissue: Secondary | ICD-10-CM | POA: Diagnosis not present

## 2021-11-09 DIAGNOSIS — G8929 Other chronic pain: Secondary | ICD-10-CM | POA: Diagnosis not present

## 2021-11-09 DIAGNOSIS — M19042 Primary osteoarthritis, left hand: Secondary | ICD-10-CM | POA: Diagnosis not present

## 2021-11-09 DIAGNOSIS — Z791 Long term (current) use of non-steroidal anti-inflammatories (NSAID): Secondary | ICD-10-CM | POA: Diagnosis not present

## 2021-11-09 DIAGNOSIS — M79641 Pain in right hand: Secondary | ICD-10-CM | POA: Diagnosis not present

## 2021-11-09 DIAGNOSIS — M19041 Primary osteoarthritis, right hand: Secondary | ICD-10-CM | POA: Diagnosis not present

## 2021-11-10 DIAGNOSIS — M79641 Pain in right hand: Secondary | ICD-10-CM | POA: Diagnosis not present

## 2021-11-10 DIAGNOSIS — G8929 Other chronic pain: Secondary | ICD-10-CM | POA: Diagnosis not present

## 2021-11-10 DIAGNOSIS — M19042 Primary osteoarthritis, left hand: Secondary | ICD-10-CM | POA: Diagnosis not present

## 2021-11-10 DIAGNOSIS — M1811 Unilateral primary osteoarthritis of first carpometacarpal joint, right hand: Secondary | ICD-10-CM | POA: Diagnosis not present

## 2021-11-10 DIAGNOSIS — M19041 Primary osteoarthritis, right hand: Secondary | ICD-10-CM | POA: Diagnosis not present

## 2022-02-15 DIAGNOSIS — H18711 Corneal ectasia, right eye: Secondary | ICD-10-CM | POA: Diagnosis not present

## 2022-02-15 DIAGNOSIS — H52211 Irregular astigmatism, right eye: Secondary | ICD-10-CM | POA: Diagnosis not present

## 2022-02-16 DIAGNOSIS — Z23 Encounter for immunization: Secondary | ICD-10-CM | POA: Diagnosis not present

## 2022-03-13 DIAGNOSIS — I700 Atherosclerosis of aorta: Secondary | ICD-10-CM | POA: Diagnosis not present

## 2022-03-13 DIAGNOSIS — E782 Mixed hyperlipidemia: Secondary | ICD-10-CM | POA: Diagnosis not present

## 2022-03-13 DIAGNOSIS — E039 Hypothyroidism, unspecified: Secondary | ICD-10-CM | POA: Diagnosis not present

## 2022-03-13 DIAGNOSIS — R7301 Impaired fasting glucose: Secondary | ICD-10-CM | POA: Diagnosis not present

## 2022-03-23 DIAGNOSIS — Z961 Presence of intraocular lens: Secondary | ICD-10-CM | POA: Diagnosis not present

## 2022-03-23 DIAGNOSIS — H18711 Corneal ectasia, right eye: Secondary | ICD-10-CM | POA: Diagnosis not present

## 2022-03-23 DIAGNOSIS — H52211 Irregular astigmatism, right eye: Secondary | ICD-10-CM | POA: Diagnosis not present

## 2022-05-03 DIAGNOSIS — M25531 Pain in right wrist: Secondary | ICD-10-CM | POA: Diagnosis not present

## 2022-05-03 DIAGNOSIS — Z791 Long term (current) use of non-steroidal anti-inflammatories (NSAID): Secondary | ICD-10-CM | POA: Diagnosis not present

## 2022-05-03 DIAGNOSIS — M19042 Primary osteoarthritis, left hand: Secondary | ICD-10-CM | POA: Diagnosis not present

## 2022-05-03 DIAGNOSIS — G8929 Other chronic pain: Secondary | ICD-10-CM | POA: Diagnosis not present

## 2022-05-03 DIAGNOSIS — M19041 Primary osteoarthritis, right hand: Secondary | ICD-10-CM | POA: Diagnosis not present

## 2022-05-03 DIAGNOSIS — M19031 Primary osteoarthritis, right wrist: Secondary | ICD-10-CM | POA: Diagnosis not present

## 2022-05-22 IMAGING — CT CT CARDIAC CORONARY ARTERY CALCIUM SCORE
3 series · 14 of 20 positions shown, 16 images · non-contrast
Comparison: None.

CLINICAL DATA: Hyperlipidemia

EXAM:
CT CARDIAC CORONARY ARTERY CALCIUM SCORE
TECHNIQUE: Non-contrast imaging through the heart was performed using
prospective ECG gating. Image post processing was performed on an
independent workstation, allowing for quantitative analysis of the
heart and coronary arteries. Note that this exam targets the heart
and the chest was not imaged in its entirety.

[Series 2: calcium scoring 2.00 qr36 bestdiast 71% hrt calciu · axial · 0.38mm/px · z∈[+1534,+1606]mm · 4 of 60 slices shown]
[im 12/60  vessel]
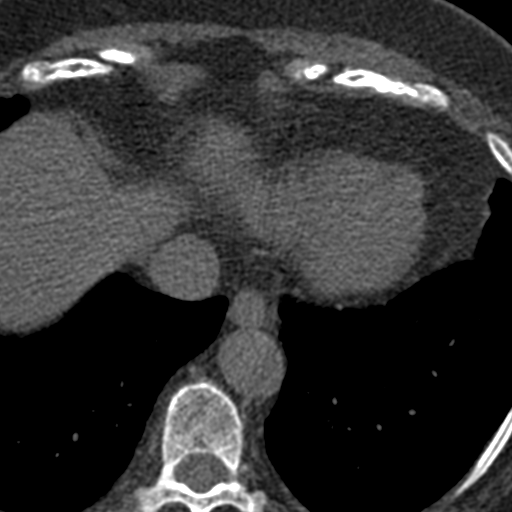
[im 24/60  vessel]
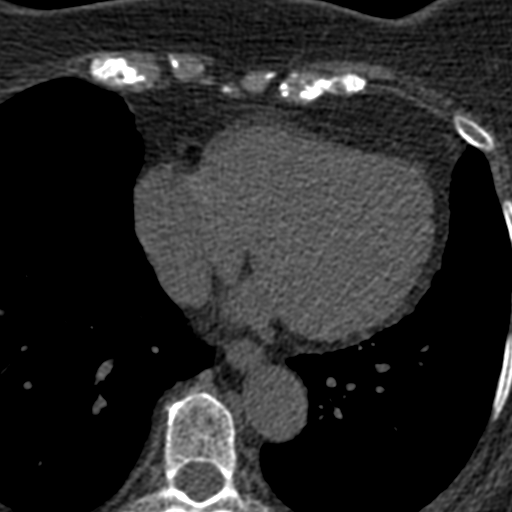
[im 36/60  vessel]
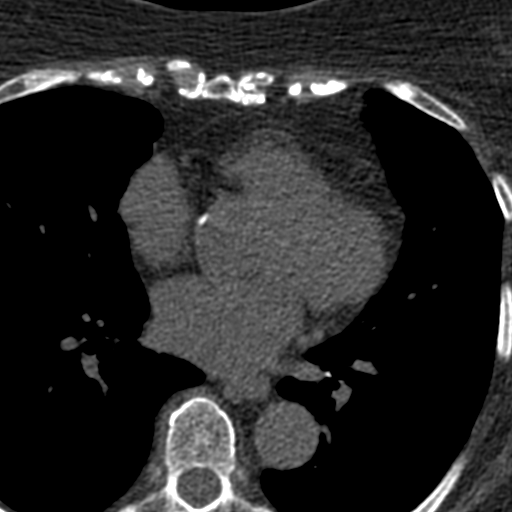
[im 48/60  vessel]
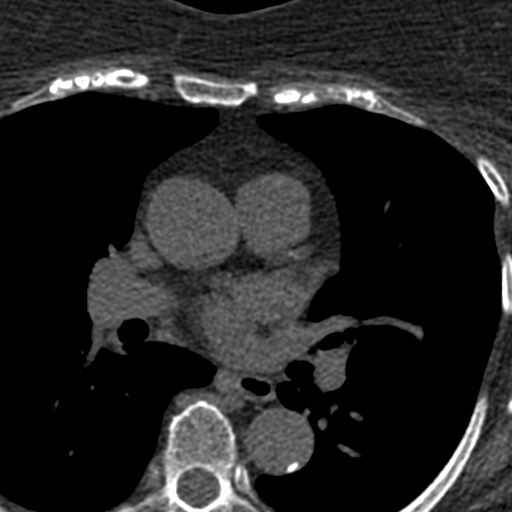

[Series 3: calcium scoring 2.00 br40 bestdiast 71% axial · axial · 0.58mm/px · z∈[+1530,+1610]mm · 5 of 60 slices shown, 7 images]
[im 10/60  vessel]
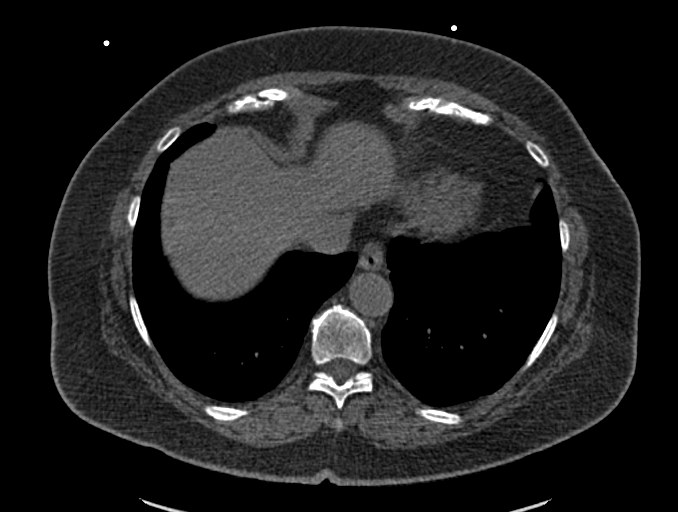
[im 10/60  lung]
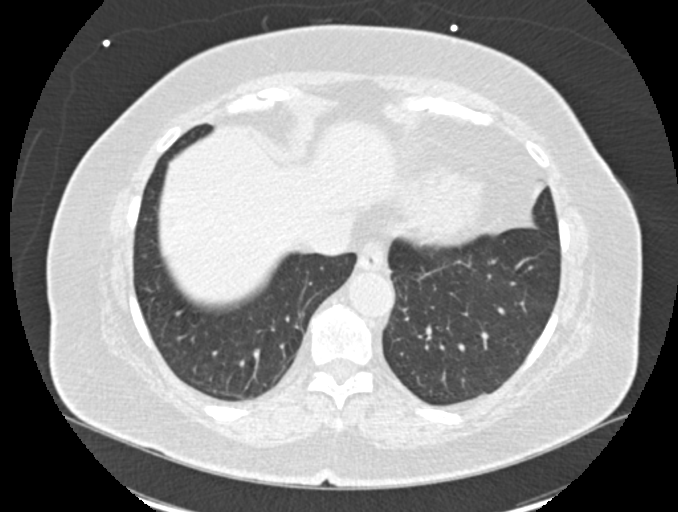
[im 20/60  vessel]
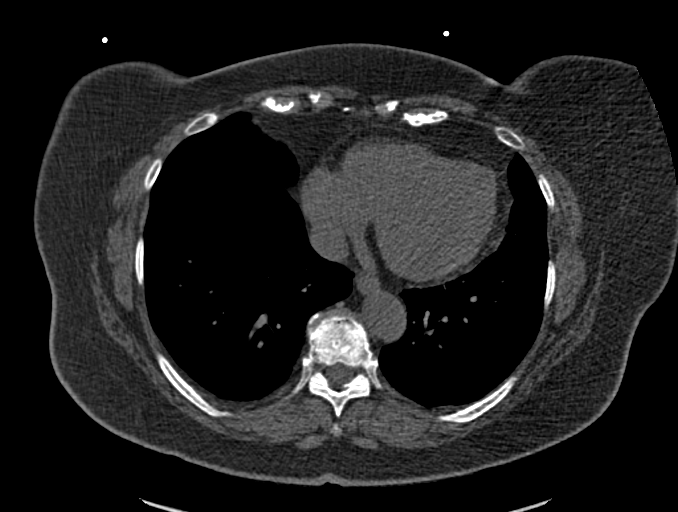
[im 30/60  vessel]
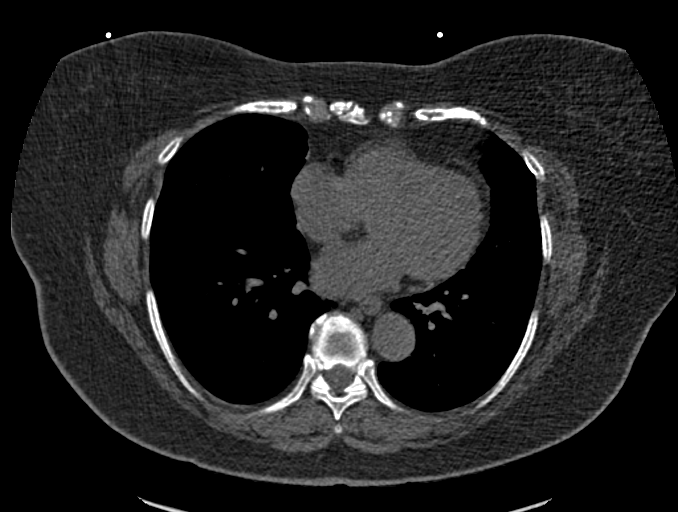
[im 40/60  vessel]
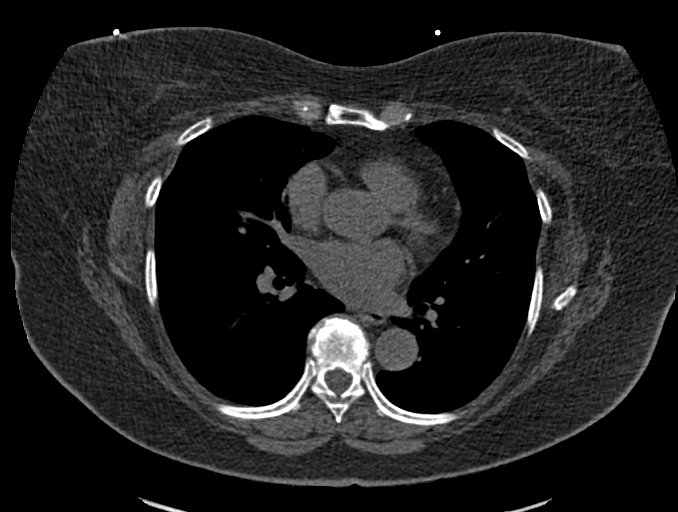
[im 50/60  vessel]
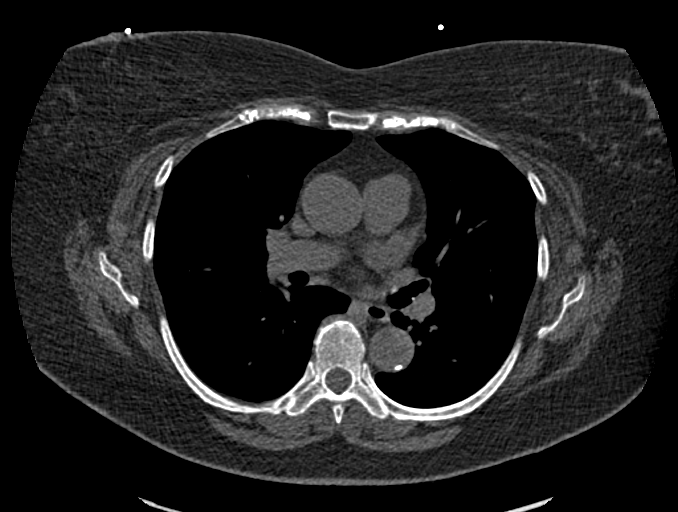
[im 50/60  lung]
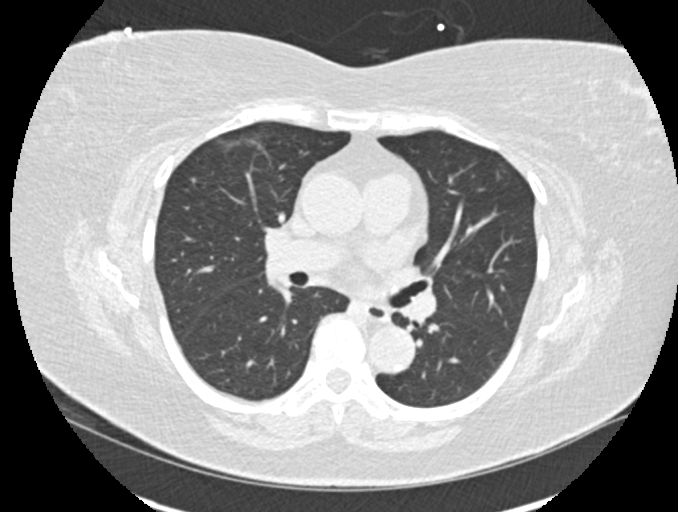

[Series 9: calcium scoring 2.00 br60 bestdiast 71% lungs · axial · 0.58mm/px · z∈[+1530,+1610]mm · 5 of 60 slices shown]
[im 10/60  vessel]
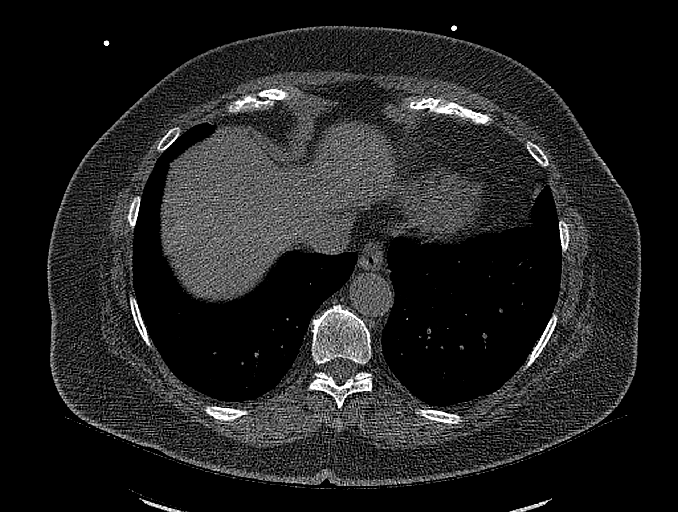
[im 20/60  vessel]
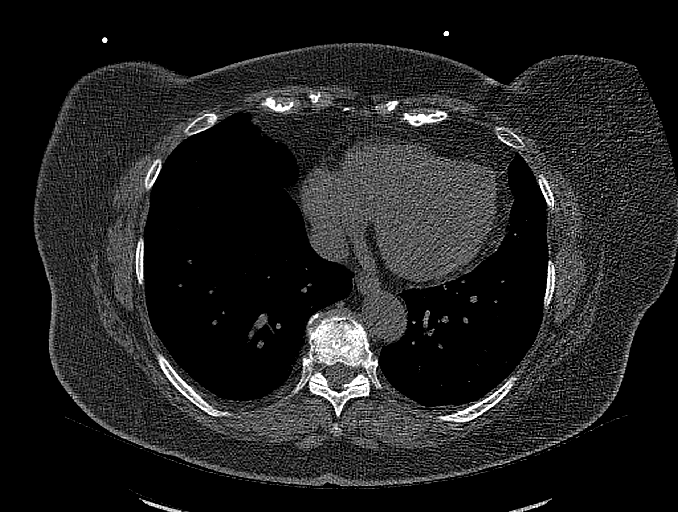
[im 30/60  vessel]
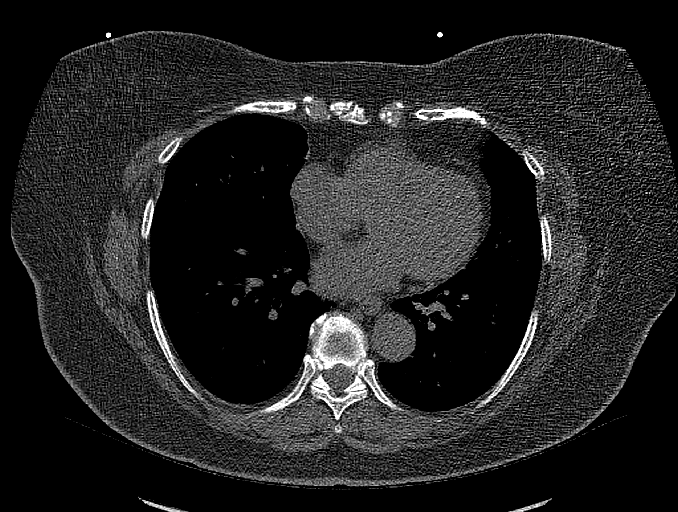
[im 40/60  vessel]
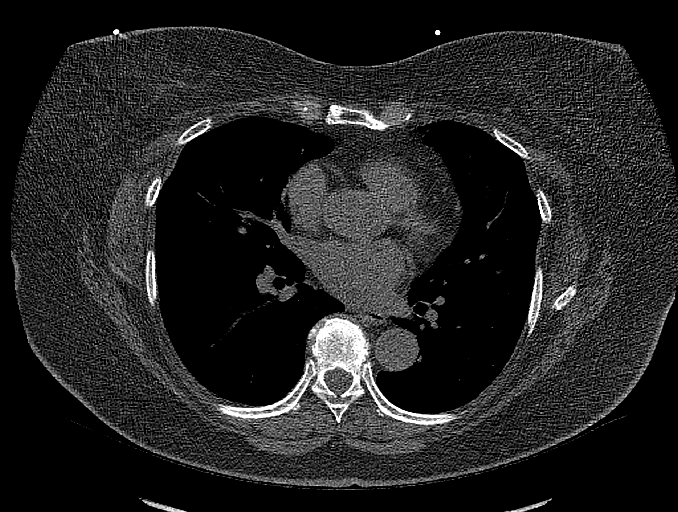
[im 50/60  vessel]
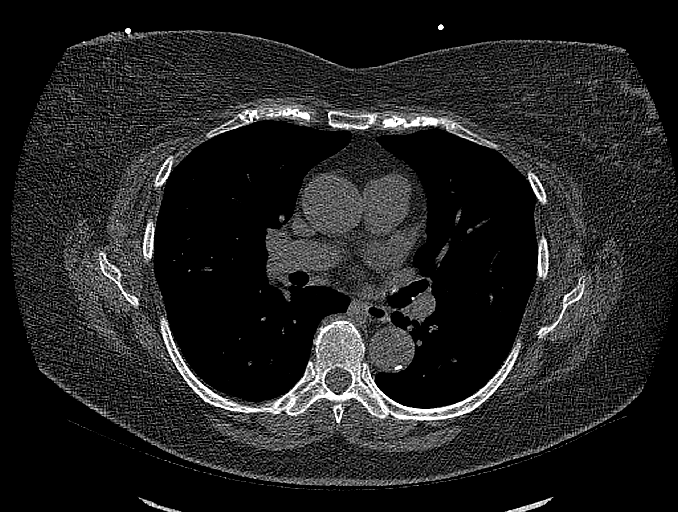

[14 of 20 positions shown; findings below may reference images not displayed]

FINDINGS: CORONARY CALCIUM SCORES:

Left Main: 0

LAD: 12

LCx: 0

RCA: 0

Total Agatston Score: 12

[HOSPITAL] percentile: 23

AORTA MEASUREMENTS:

Ascending Aorta: 34 mm

Descending Aorta: 25 mm

OTHER FINDINGS:

Heart is normal size. No adenopathy. Aorta normal caliber. Scattered
calcifications in the aortic root and descending thoracic aorta.
Visualized lungs clear. No effusions. Imaging into the upper abdomen
demonstrates no acute findings. Chest wall soft tissues are
unremarkable. No acute bony abnormality.
IMPRESSION: The observed calcium score of 12 is at the percentile 23 for
subjects of the same age, gender and race/ethnicity who are free of
clinical cardiovascular disease and treated diabetes.

Aortic atherosclerosis.

No acute findings.

## 2022-05-24 DIAGNOSIS — H0288A Meibomian gland dysfunction right eye, upper and lower eyelids: Secondary | ICD-10-CM | POA: Diagnosis not present

## 2022-05-24 DIAGNOSIS — H52211 Irregular astigmatism, right eye: Secondary | ICD-10-CM | POA: Diagnosis not present

## 2022-05-24 DIAGNOSIS — H18711 Corneal ectasia, right eye: Secondary | ICD-10-CM | POA: Diagnosis not present

## 2022-05-24 DIAGNOSIS — Z961 Presence of intraocular lens: Secondary | ICD-10-CM | POA: Diagnosis not present

## 2022-09-18 DIAGNOSIS — R92333 Mammographic heterogeneous density, bilateral breasts: Secondary | ICD-10-CM | POA: Diagnosis not present

## 2022-09-18 DIAGNOSIS — Z1231 Encounter for screening mammogram for malignant neoplasm of breast: Secondary | ICD-10-CM | POA: Diagnosis not present

## 2022-09-28 DIAGNOSIS — E782 Mixed hyperlipidemia: Secondary | ICD-10-CM | POA: Diagnosis not present

## 2022-09-28 DIAGNOSIS — R7301 Impaired fasting glucose: Secondary | ICD-10-CM | POA: Diagnosis not present

## 2022-09-28 DIAGNOSIS — E039 Hypothyroidism, unspecified: Secondary | ICD-10-CM | POA: Diagnosis not present

## 2022-09-28 DIAGNOSIS — E559 Vitamin D deficiency, unspecified: Secondary | ICD-10-CM | POA: Diagnosis not present

## 2022-09-28 DIAGNOSIS — R7989 Other specified abnormal findings of blood chemistry: Secondary | ICD-10-CM | POA: Diagnosis not present

## 2022-09-28 DIAGNOSIS — M8580 Other specified disorders of bone density and structure, unspecified site: Secondary | ICD-10-CM | POA: Diagnosis not present

## 2022-10-02 DIAGNOSIS — L821 Other seborrheic keratosis: Secondary | ICD-10-CM | POA: Diagnosis not present

## 2022-10-02 DIAGNOSIS — L565 Disseminated superficial actinic porokeratosis (DSAP): Secondary | ICD-10-CM | POA: Diagnosis not present

## 2022-10-02 DIAGNOSIS — Z86008 Personal history of in-situ neoplasm of other site: Secondary | ICD-10-CM | POA: Diagnosis not present

## 2022-10-02 DIAGNOSIS — D1801 Hemangioma of skin and subcutaneous tissue: Secondary | ICD-10-CM | POA: Diagnosis not present

## 2022-10-02 DIAGNOSIS — E663 Overweight: Secondary | ICD-10-CM | POA: Diagnosis not present

## 2022-10-02 DIAGNOSIS — L820 Inflamed seborrheic keratosis: Secondary | ICD-10-CM | POA: Diagnosis not present

## 2022-10-02 DIAGNOSIS — L570 Actinic keratosis: Secondary | ICD-10-CM | POA: Diagnosis not present

## 2022-10-04 DIAGNOSIS — H43813 Vitreous degeneration, bilateral: Secondary | ICD-10-CM | POA: Diagnosis not present

## 2022-10-04 DIAGNOSIS — Z961 Presence of intraocular lens: Secondary | ICD-10-CM | POA: Diagnosis not present

## 2022-10-04 DIAGNOSIS — H18711 Corneal ectasia, right eye: Secondary | ICD-10-CM | POA: Diagnosis not present

## 2022-10-04 DIAGNOSIS — H0288A Meibomian gland dysfunction right eye, upper and lower eyelids: Secondary | ICD-10-CM | POA: Diagnosis not present

## 2022-10-05 DIAGNOSIS — Z1339 Encounter for screening examination for other mental health and behavioral disorders: Secondary | ICD-10-CM | POA: Diagnosis not present

## 2022-10-05 DIAGNOSIS — Z23 Encounter for immunization: Secondary | ICD-10-CM | POA: Diagnosis not present

## 2022-10-05 DIAGNOSIS — R82998 Other abnormal findings in urine: Secondary | ICD-10-CM | POA: Diagnosis not present

## 2022-10-05 DIAGNOSIS — G4733 Obstructive sleep apnea (adult) (pediatric): Secondary | ICD-10-CM | POA: Diagnosis not present

## 2022-10-05 DIAGNOSIS — Z0000 Encounter for general adult medical examination without abnormal findings: Secondary | ICD-10-CM | POA: Diagnosis not present

## 2022-10-05 DIAGNOSIS — I700 Atherosclerosis of aorta: Secondary | ICD-10-CM | POA: Diagnosis not present

## 2022-10-05 DIAGNOSIS — E782 Mixed hyperlipidemia: Secondary | ICD-10-CM | POA: Diagnosis not present

## 2022-10-05 DIAGNOSIS — Z1331 Encounter for screening for depression: Secondary | ICD-10-CM | POA: Diagnosis not present

## 2022-10-05 DIAGNOSIS — M8580 Other specified disorders of bone density and structure, unspecified site: Secondary | ICD-10-CM | POA: Diagnosis not present

## 2022-10-05 DIAGNOSIS — M179 Osteoarthritis of knee, unspecified: Secondary | ICD-10-CM | POA: Diagnosis not present

## 2022-10-05 DIAGNOSIS — M5030 Other cervical disc degeneration, unspecified cervical region: Secondary | ICD-10-CM | POA: Diagnosis not present

## 2022-10-05 DIAGNOSIS — I2510 Atherosclerotic heart disease of native coronary artery without angina pectoris: Secondary | ICD-10-CM | POA: Diagnosis not present

## 2022-10-05 DIAGNOSIS — E039 Hypothyroidism, unspecified: Secondary | ICD-10-CM | POA: Diagnosis not present

## 2022-10-11 DIAGNOSIS — M19042 Primary osteoarthritis, left hand: Secondary | ICD-10-CM | POA: Diagnosis not present

## 2022-10-11 DIAGNOSIS — Z791 Long term (current) use of non-steroidal anti-inflammatories (NSAID): Secondary | ICD-10-CM | POA: Diagnosis not present

## 2022-10-11 DIAGNOSIS — M19041 Primary osteoarthritis, right hand: Secondary | ICD-10-CM | POA: Diagnosis not present

## 2022-10-26 DIAGNOSIS — E782 Mixed hyperlipidemia: Secondary | ICD-10-CM | POA: Diagnosis not present

## 2022-11-30 DIAGNOSIS — M25551 Pain in right hip: Secondary | ICD-10-CM | POA: Diagnosis not present

## 2022-12-25 DIAGNOSIS — M25551 Pain in right hip: Secondary | ICD-10-CM | POA: Diagnosis not present

## 2022-12-25 DIAGNOSIS — M545 Low back pain, unspecified: Secondary | ICD-10-CM | POA: Diagnosis not present

## 2022-12-28 DIAGNOSIS — M5416 Radiculopathy, lumbar region: Secondary | ICD-10-CM | POA: Diagnosis not present

## 2023-01-03 DIAGNOSIS — M5416 Radiculopathy, lumbar region: Secondary | ICD-10-CM | POA: Diagnosis not present

## 2023-01-09 DIAGNOSIS — M5416 Radiculopathy, lumbar region: Secondary | ICD-10-CM | POA: Diagnosis not present

## 2023-01-14 DIAGNOSIS — M5451 Vertebrogenic low back pain: Secondary | ICD-10-CM | POA: Diagnosis not present

## 2023-01-15 DIAGNOSIS — H52211 Irregular astigmatism, right eye: Secondary | ICD-10-CM | POA: Diagnosis not present

## 2023-01-15 DIAGNOSIS — H00024 Hordeolum internum left upper eyelid: Secondary | ICD-10-CM | POA: Diagnosis not present

## 2023-01-15 DIAGNOSIS — H0288A Meibomian gland dysfunction right eye, upper and lower eyelids: Secondary | ICD-10-CM | POA: Diagnosis not present

## 2023-01-16 DIAGNOSIS — M5451 Vertebrogenic low back pain: Secondary | ICD-10-CM | POA: Diagnosis not present

## 2023-01-18 DIAGNOSIS — M5451 Vertebrogenic low back pain: Secondary | ICD-10-CM | POA: Diagnosis not present

## 2023-01-21 DIAGNOSIS — M5451 Vertebrogenic low back pain: Secondary | ICD-10-CM | POA: Diagnosis not present

## 2023-01-23 DIAGNOSIS — M5451 Vertebrogenic low back pain: Secondary | ICD-10-CM | POA: Diagnosis not present

## 2023-01-24 DIAGNOSIS — M5451 Vertebrogenic low back pain: Secondary | ICD-10-CM | POA: Diagnosis not present

## 2023-01-25 DIAGNOSIS — M5416 Radiculopathy, lumbar region: Secondary | ICD-10-CM | POA: Diagnosis not present

## 2023-01-25 DIAGNOSIS — M47896 Other spondylosis, lumbar region: Secondary | ICD-10-CM | POA: Diagnosis not present

## 2023-01-29 DIAGNOSIS — M5451 Vertebrogenic low back pain: Secondary | ICD-10-CM | POA: Diagnosis not present

## 2023-01-30 DIAGNOSIS — M5451 Vertebrogenic low back pain: Secondary | ICD-10-CM | POA: Diagnosis not present

## 2023-02-05 DIAGNOSIS — M5451 Vertebrogenic low back pain: Secondary | ICD-10-CM | POA: Diagnosis not present

## 2023-02-05 DIAGNOSIS — R260 Ataxic gait: Secondary | ICD-10-CM | POA: Diagnosis not present

## 2023-02-05 DIAGNOSIS — M5441 Lumbago with sciatica, right side: Secondary | ICD-10-CM | POA: Diagnosis not present

## 2023-02-06 DIAGNOSIS — M5451 Vertebrogenic low back pain: Secondary | ICD-10-CM | POA: Diagnosis not present

## 2023-02-07 DIAGNOSIS — M5451 Vertebrogenic low back pain: Secondary | ICD-10-CM | POA: Diagnosis not present

## 2023-02-11 DIAGNOSIS — M5451 Vertebrogenic low back pain: Secondary | ICD-10-CM | POA: Diagnosis not present

## 2023-02-15 DIAGNOSIS — M5451 Vertebrogenic low back pain: Secondary | ICD-10-CM | POA: Diagnosis not present

## 2023-02-18 DIAGNOSIS — M5451 Vertebrogenic low back pain: Secondary | ICD-10-CM | POA: Diagnosis not present

## 2023-02-20 DIAGNOSIS — M5416 Radiculopathy, lumbar region: Secondary | ICD-10-CM | POA: Diagnosis not present

## 2023-03-07 DIAGNOSIS — M5416 Radiculopathy, lumbar region: Secondary | ICD-10-CM | POA: Diagnosis not present

## 2023-03-07 DIAGNOSIS — M792 Neuralgia and neuritis, unspecified: Secondary | ICD-10-CM | POA: Diagnosis not present

## 2023-03-07 DIAGNOSIS — M47896 Other spondylosis, lumbar region: Secondary | ICD-10-CM | POA: Diagnosis not present

## 2023-03-21 DIAGNOSIS — Z86008 Personal history of in-situ neoplasm of other site: Secondary | ICD-10-CM | POA: Diagnosis not present

## 2023-03-21 DIAGNOSIS — D1801 Hemangioma of skin and subcutaneous tissue: Secondary | ICD-10-CM | POA: Diagnosis not present

## 2023-03-21 DIAGNOSIS — Z129 Encounter for screening for malignant neoplasm, site unspecified: Secondary | ICD-10-CM | POA: Diagnosis not present

## 2023-03-21 DIAGNOSIS — L820 Inflamed seborrheic keratosis: Secondary | ICD-10-CM | POA: Diagnosis not present

## 2023-03-21 DIAGNOSIS — L821 Other seborrheic keratosis: Secondary | ICD-10-CM | POA: Diagnosis not present

## 2023-03-21 DIAGNOSIS — L570 Actinic keratosis: Secondary | ICD-10-CM | POA: Diagnosis not present

## 2023-03-21 DIAGNOSIS — L565 Disseminated superficial actinic porokeratosis (DSAP): Secondary | ICD-10-CM | POA: Diagnosis not present

## 2023-03-22 DIAGNOSIS — H18711 Corneal ectasia, right eye: Secondary | ICD-10-CM | POA: Diagnosis not present

## 2023-03-22 DIAGNOSIS — H16223 Keratoconjunctivitis sicca, not specified as Sjogren's, bilateral: Secondary | ICD-10-CM | POA: Diagnosis not present

## 2023-03-22 DIAGNOSIS — Z961 Presence of intraocular lens: Secondary | ICD-10-CM | POA: Diagnosis not present

## 2023-04-08 DIAGNOSIS — R7301 Impaired fasting glucose: Secondary | ICD-10-CM | POA: Diagnosis not present

## 2023-04-08 DIAGNOSIS — I2510 Atherosclerotic heart disease of native coronary artery without angina pectoris: Secondary | ICD-10-CM | POA: Diagnosis not present

## 2023-04-08 DIAGNOSIS — I700 Atherosclerosis of aorta: Secondary | ICD-10-CM | POA: Diagnosis not present

## 2023-04-08 DIAGNOSIS — M5030 Other cervical disc degeneration, unspecified cervical region: Secondary | ICD-10-CM | POA: Diagnosis not present

## 2023-04-08 DIAGNOSIS — E782 Mixed hyperlipidemia: Secondary | ICD-10-CM | POA: Diagnosis not present

## 2023-04-08 DIAGNOSIS — I10 Essential (primary) hypertension: Secondary | ICD-10-CM | POA: Diagnosis not present

## 2023-04-08 DIAGNOSIS — E039 Hypothyroidism, unspecified: Secondary | ICD-10-CM | POA: Diagnosis not present

## 2023-04-08 DIAGNOSIS — M179 Osteoarthritis of knee, unspecified: Secondary | ICD-10-CM | POA: Diagnosis not present

## 2023-04-08 DIAGNOSIS — G4733 Obstructive sleep apnea (adult) (pediatric): Secondary | ICD-10-CM | POA: Diagnosis not present

## 2023-04-15 DIAGNOSIS — Z1330 Encounter for screening examination for mental health and behavioral disorders, unspecified: Secondary | ICD-10-CM | POA: Diagnosis not present

## 2023-04-15 DIAGNOSIS — M19042 Primary osteoarthritis, left hand: Secondary | ICD-10-CM | POA: Diagnosis not present

## 2023-04-15 DIAGNOSIS — Z791 Long term (current) use of non-steroidal anti-inflammatories (NSAID): Secondary | ICD-10-CM | POA: Diagnosis not present

## 2023-04-15 DIAGNOSIS — M19041 Primary osteoarthritis, right hand: Secondary | ICD-10-CM | POA: Diagnosis not present

## 2023-05-13 DIAGNOSIS — M8589 Other specified disorders of bone density and structure, multiple sites: Secondary | ICD-10-CM | POA: Diagnosis not present

## 2023-05-14 DIAGNOSIS — M5416 Radiculopathy, lumbar region: Secondary | ICD-10-CM | POA: Diagnosis not present

## 2023-05-22 DIAGNOSIS — H16223 Keratoconjunctivitis sicca, not specified as Sjogren's, bilateral: Secondary | ICD-10-CM | POA: Diagnosis not present

## 2023-05-31 DIAGNOSIS — M47816 Spondylosis without myelopathy or radiculopathy, lumbar region: Secondary | ICD-10-CM | POA: Diagnosis not present

## 2023-05-31 DIAGNOSIS — M5416 Radiculopathy, lumbar region: Secondary | ICD-10-CM | POA: Diagnosis not present

## 2023-06-04 DIAGNOSIS — G4733 Obstructive sleep apnea (adult) (pediatric): Secondary | ICD-10-CM | POA: Diagnosis not present

## 2023-06-04 DIAGNOSIS — I2510 Atherosclerotic heart disease of native coronary artery without angina pectoris: Secondary | ICD-10-CM | POA: Diagnosis not present

## 2023-06-04 DIAGNOSIS — R0602 Shortness of breath: Secondary | ICD-10-CM | POA: Diagnosis not present

## 2023-09-06 DIAGNOSIS — H18711 Corneal ectasia, right eye: Secondary | ICD-10-CM | POA: Diagnosis not present

## 2023-09-06 DIAGNOSIS — Z961 Presence of intraocular lens: Secondary | ICD-10-CM | POA: Diagnosis not present

## 2023-09-06 DIAGNOSIS — H16223 Keratoconjunctivitis sicca, not specified as Sjogren's, bilateral: Secondary | ICD-10-CM | POA: Diagnosis not present

## 2023-09-23 DIAGNOSIS — Z1231 Encounter for screening mammogram for malignant neoplasm of breast: Secondary | ICD-10-CM | POA: Diagnosis not present

## 2023-09-24 DIAGNOSIS — Z86008 Personal history of in-situ neoplasm of other site: Secondary | ICD-10-CM | POA: Diagnosis not present

## 2023-09-24 DIAGNOSIS — L578 Other skin changes due to chronic exposure to nonionizing radiation: Secondary | ICD-10-CM | POA: Diagnosis not present

## 2023-09-24 DIAGNOSIS — D1801 Hemangioma of skin and subcutaneous tissue: Secondary | ICD-10-CM | POA: Diagnosis not present

## 2023-09-24 DIAGNOSIS — Z129 Encounter for screening for malignant neoplasm, site unspecified: Secondary | ICD-10-CM | POA: Diagnosis not present

## 2023-09-24 DIAGNOSIS — L821 Other seborrheic keratosis: Secondary | ICD-10-CM | POA: Diagnosis not present

## 2023-09-24 DIAGNOSIS — W908XXS Exposure to other nonionizing radiation, sequela: Secondary | ICD-10-CM | POA: Diagnosis not present

## 2023-09-24 DIAGNOSIS — L565 Disseminated superficial actinic porokeratosis (DSAP): Secondary | ICD-10-CM | POA: Diagnosis not present

## 2023-10-21 DIAGNOSIS — M19042 Primary osteoarthritis, left hand: Secondary | ICD-10-CM | POA: Diagnosis not present

## 2023-10-21 DIAGNOSIS — M19041 Primary osteoarthritis, right hand: Secondary | ICD-10-CM | POA: Diagnosis not present

## 2023-11-19 DIAGNOSIS — E039 Hypothyroidism, unspecified: Secondary | ICD-10-CM | POA: Diagnosis not present

## 2023-11-19 DIAGNOSIS — I10 Essential (primary) hypertension: Secondary | ICD-10-CM | POA: Diagnosis not present

## 2023-11-19 DIAGNOSIS — E782 Mixed hyperlipidemia: Secondary | ICD-10-CM | POA: Diagnosis not present

## 2023-11-19 DIAGNOSIS — R7301 Impaired fasting glucose: Secondary | ICD-10-CM | POA: Diagnosis not present

## 2023-11-19 DIAGNOSIS — E559 Vitamin D deficiency, unspecified: Secondary | ICD-10-CM | POA: Diagnosis not present

## 2023-11-26 DIAGNOSIS — R7301 Impaired fasting glucose: Secondary | ICD-10-CM | POA: Diagnosis not present

## 2023-11-26 DIAGNOSIS — E039 Hypothyroidism, unspecified: Secondary | ICD-10-CM | POA: Diagnosis not present

## 2023-11-26 DIAGNOSIS — R82998 Other abnormal findings in urine: Secondary | ICD-10-CM | POA: Diagnosis not present

## 2023-11-26 DIAGNOSIS — I2510 Atherosclerotic heart disease of native coronary artery without angina pectoris: Secondary | ICD-10-CM | POA: Diagnosis not present

## 2023-11-26 DIAGNOSIS — Z0000 Encounter for general adult medical examination without abnormal findings: Secondary | ICD-10-CM | POA: Diagnosis not present

## 2023-11-26 DIAGNOSIS — I10 Essential (primary) hypertension: Secondary | ICD-10-CM | POA: Diagnosis not present

## 2023-11-26 DIAGNOSIS — I479 Paroxysmal tachycardia, unspecified: Secondary | ICD-10-CM | POA: Diagnosis not present

## 2023-11-26 DIAGNOSIS — H9193 Unspecified hearing loss, bilateral: Secondary | ICD-10-CM | POA: Diagnosis not present

## 2023-11-26 DIAGNOSIS — I700 Atherosclerosis of aorta: Secondary | ICD-10-CM | POA: Diagnosis not present

## 2023-11-29 DIAGNOSIS — H16223 Keratoconjunctivitis sicca, not specified as Sjogren's, bilateral: Secondary | ICD-10-CM | POA: Diagnosis not present

## 2024-01-16 DIAGNOSIS — L578 Other skin changes due to chronic exposure to nonionizing radiation: Secondary | ICD-10-CM | POA: Diagnosis not present

## 2024-01-16 DIAGNOSIS — W908XXS Exposure to other nonionizing radiation, sequela: Secondary | ICD-10-CM | POA: Diagnosis not present

## 2024-01-20 DIAGNOSIS — N390 Urinary tract infection, site not specified: Secondary | ICD-10-CM | POA: Diagnosis not present

## 2024-01-20 DIAGNOSIS — E039 Hypothyroidism, unspecified: Secondary | ICD-10-CM | POA: Diagnosis not present

## 2024-02-19 DIAGNOSIS — R82998 Other abnormal findings in urine: Secondary | ICD-10-CM | POA: Diagnosis not present

## 2024-03-17 DIAGNOSIS — I10 Essential (primary) hypertension: Secondary | ICD-10-CM | POA: Diagnosis not present

## 2024-03-17 DIAGNOSIS — R413 Other amnesia: Secondary | ICD-10-CM | POA: Diagnosis not present

## 2024-03-17 DIAGNOSIS — E663 Overweight: Secondary | ICD-10-CM | POA: Diagnosis not present

## 2024-03-24 DIAGNOSIS — L821 Other seborrheic keratosis: Secondary | ICD-10-CM | POA: Diagnosis not present

## 2024-03-24 DIAGNOSIS — Z86008 Personal history of in-situ neoplasm of other site: Secondary | ICD-10-CM | POA: Diagnosis not present

## 2024-03-24 DIAGNOSIS — D1801 Hemangioma of skin and subcutaneous tissue: Secondary | ICD-10-CM | POA: Diagnosis not present

## 2024-03-24 DIAGNOSIS — Z129 Encounter for screening for malignant neoplasm, site unspecified: Secondary | ICD-10-CM | POA: Diagnosis not present

## 2024-03-24 DIAGNOSIS — L565 Disseminated superficial actinic porokeratosis (DSAP): Secondary | ICD-10-CM | POA: Diagnosis not present

## 2024-04-06 DIAGNOSIS — Z961 Presence of intraocular lens: Secondary | ICD-10-CM | POA: Diagnosis not present

## 2024-04-06 DIAGNOSIS — H16223 Keratoconjunctivitis sicca, not specified as Sjogren's, bilateral: Secondary | ICD-10-CM | POA: Diagnosis not present

## 2024-04-06 DIAGNOSIS — H00014 Hordeolum externum left upper eyelid: Secondary | ICD-10-CM | POA: Diagnosis not present

## 2024-04-17 ENCOUNTER — Encounter: Payer: Self-pay | Admitting: Cardiology

## 2024-04-17 ENCOUNTER — Ambulatory Visit: Attending: Cardiology | Admitting: Cardiology

## 2024-04-17 VITALS — BP 140/64 | HR 87 | Resp 16 | Ht 63.0 in | Wt 181.4 lb

## 2024-04-17 DIAGNOSIS — R931 Abnormal findings on diagnostic imaging of heart and coronary circulation: Secondary | ICD-10-CM | POA: Diagnosis not present

## 2024-04-17 DIAGNOSIS — I10 Essential (primary) hypertension: Secondary | ICD-10-CM | POA: Diagnosis not present

## 2024-04-17 DIAGNOSIS — R000 Tachycardia, unspecified: Secondary | ICD-10-CM

## 2024-04-17 DIAGNOSIS — E782 Mixed hyperlipidemia: Secondary | ICD-10-CM

## 2024-04-17 DIAGNOSIS — R0602 Shortness of breath: Secondary | ICD-10-CM

## 2024-04-17 NOTE — Patient Instructions (Signed)
 Medication Instructions:  Your physician recommends that you continue on your current medications as directed. Please refer to the Current Medication list given to you today.  *If you need a refill on your cardiac medications before your next appointment, please call your pharmacy*  Lab Work: NONE If you have labs (blood work) drawn today and your tests are completely normal, you will receive your results only by: MyChart Message (if you have MyChart) OR A paper copy in the mail If you have any lab test that is abnormal or we need to change your treatment, we will call you to review the results.  Testing/Procedures: Echocardiogram Your physician has requested that you have an echocardiogram. Echocardiography is a painless test that uses sound waves to create images of your heart. It provides your doctor with information about the size and shape of your heart and how well your heart's chambers and valves are working. This procedure takes approximately one hour. There are no restrictions for this procedure. Please do NOT wear cologne, perfume, aftershave, or lotions (deodorant is allowed). Please arrive 15 minutes prior to your appointment time.  Please note: We ask at that you not bring children with you during ultrasound (echo/ vascular) testing. Due to room size and safety concerns, children are not allowed in the ultrasound rooms during exams. Our front office staff cannot provide observation of children in our lobby area while testing is being conducted. An adult accompanying a patient to their appointment will only be allowed in the ultrasound room at the discretion of the ultrasound technician under special circumstances. We apologize for any inconvenience.   Follow-Up: At [facility], you and your health needs are our priority.  As part of our continuing mission to provide you with exceptional heart care, our providers are all part of one team.  This team includes your primary  Cardiologist (physician) and Advanced Practice Providers or APPs (Physician Assistants and Nurse Practitioners) who all work together to provide you with the care you need, when you need it.  Your next appointment:   2 year(s)  Provider:   Albert Huff, DO   We recommend signing up for the patient portal called "MyChart".  Sign up information is provided on this After Visit Summary.  MyChart is used to connect with patients for Virtual Visits (Telemedicine).  Patients are able to view lab/test results, encounter notes, upcoming appointments, etc.  Non-urgent messages can be sent to your provider as well.   To learn more about what you can do with MyChart, go to [email or web address].   Other Instructions

## 2024-04-17 NOTE — Progress Notes (Signed)
 Cardiology Office Note:    Date:  04/17/2024  NAME:  Lori Meyers    MRN: [id number] DOB:  1941-06-15   PCP:  Aldo Hun, MD  Former Cardiology Providers: None Primary Cardiologist:  None, FACC (established care 04/17/2024) Electrophysiologist:  None   Referring MD: Aldo Hun, MD  Reason of Consult: Paroxysmal tachycardia  Chief Complaint  Patient presents with   Tachycardia   New Patient (Initial Visit)    History of Present Illness:    Lori Meyers is a 83 y.o. Caucasian female whose past medical history and cardiovascular risk factors includes: Hypertension, hyperlipidemia, hypothyroidism, aortic atherosclerosis, OSA,mild CAC. She is being seen today for the evaluation of paroxysmal tachycardia at the request of Aldo Hun, MD.  Reason of consult mentions paroxysmal tachycardia but patient does not endorse this.  Patient denies any palpitations, near-syncope or syncopal events.  Patient states that she had an episode of shortness of breath in the last winter of unknown etiology but has resolved since then.  No change in overall physical endurance.  However her exercise capacity has been limited due to back pain requiring spinal injections in the past.  Mom died in her 34s due to MI.   Current Medications: Current Meds  Medication Sig   acetaminophen  (TYLENOL ) 325 MG tablet Take 2 tablets (650 mg total) by mouth every 6 (six) hours as needed for mild pain (or Fever >/= 101).   atorvastatin  (LIPITOR ) 80 MG tablet Take 80 mg by mouth every evening.    CELEBREX 200 MG capsule    ezetimibe  (ZETIA ) 10 MG tablet Take 10 mg by mouth every morning.   Famotidine (PEPCID PO) Take 1,200 mg by mouth daily.   GLUCOSAMINE-CHONDROITIN PO Take 1 Capful by mouth daily.   levothyroxine  (SYNTHROID , LEVOTHROID) 50 MCG tablet Take 75-100 mcg by mouth daily before breakfast. Takes 2 tablets on Monday Wednesday and Friday and 1 and 1/2 the rest of the week   liothyronine  (CYTOMEL ) 5 MCG  tablet Take 5 mcg by mouth 2 (two) times daily.   Multiple Vitamin (MULTIVITAMIN) capsule Take 1 capsule by mouth daily.   telmisartan (MICARDIS) 20 MG tablet Take 20 mg by mouth at bedtime.   XIIDRA 5 % SOLN Apply 1 drop to eye 2 (two) times daily.     Allergies:    Niacin and related   Past Medical History: Past Medical History:  Diagnosis Date   Arthritis    arms, knees   Bursitis of left knee    Hearing aid worn    History of chicken pox    HLD (hyperlipidemia)    Hypercholesteremia    under control   Hypothyroidism    thyroidectomy-under control   OA (osteoarthritis) of knee    Osteopenia    Skin cancer    Vitamin D deficiency     Past Surgical History: Past Surgical History:  Procedure Laterality Date   ABDOMINAL HYSTERECTOMY  1995   with bladder tac   APPENDECTOMY  5th grade   BILATERAL SALPINGOOPHORECTOMY     FINGER SURGERY  ~2005   joint replaced on right hand middle finger   JOINT REPLACEMENT     MENISCUS REPAIR Left 2007   at office   SKIN CANCER DESTRUCTION     "pre-cancerous" on back of leg   THYROIDECTOMY  1990   "zapped"   TONSILLECTOMY  as child   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  ROS:   Review of Systems  Cardiovascular:  Negative for chest pain, claudication, irregular heartbeat, leg swelling, near-syncope, orthopnea, palpitations, paroxysmal nocturnal dyspnea and syncope.  Respiratory:  Negative for shortness of breath.    Hematologic/Lymphatic: Negative for bleeding problem.  Musculoskeletal:  Positive for arthritis.    EKGs/Labs/Other Studies Reviewed:   EKG: EKG Interpretation Date/Time:  Friday Apr 17 2024 08:37:31 EDT Ventricular Rate:  83 PR Interval:  142 QRS Duration:  78 QT Interval:  404 QTC Calculation: 474 R Axis:   9  Text Interpretation: Normal sinus rhythm When compared with ECG of 26-May-2014 04:56, T wave amplitude has decreased in Anterior leads Confirmed by Olinda Bertrand [id number]) on 04/17/2024 8:47:11 AM  Echocardiogram: N/A  Stress Testing:  N/A  Coronary artery calcium  score: August 2021: Total CAC 12, 23rd percentile. Aortic atherosclerosis  Labs:  External Labs: Collected: November 19, 2023 provided by PCP. BUN 14, creatinine 0.7. Sodium 143, potassium 4.1, chloride 105, bicarb 29. AST, ALT, alkaline phosphatase within normal limits. Hemoglobin 14.8, hematocrit 43% TSH 6.48. A1c 5.9  Physical Exam:    Today's Vitals   04/17/24 0811  BP: (!) 140/64  Pulse: 87  Resp: 16  SpO2: 96%  Weight: 181 lb 6.4 oz (82.3 kg)  Height: 5\' 3"  (1.6 m)   Body mass index is 32.13 kg/m. Wt Readings from Last 3 Encounters:  04/17/24 181 lb 6.4 oz (82.3 kg)  08/10/20 179 lb (81.2 kg)  03/30/15 171 lb 12.8 oz (77.9 kg)    Physical Exam  Constitutional: No distress.  hemodynamically stable  HENT:  Hard of hearing  Neck: No JVD present.  Cardiovascular: Normal rate, regular rhythm, S1 normal and S2 normal. Exam reveals no gallop, no S3 and no S4.  No murmur heard. Pulmonary/Chest: Effort normal and breath sounds normal. No stridor. She has no wheezes. She has no rales.  Musculoskeletal:        General: No edema.     Cervical back: Neck supple.  Skin: Skin is warm.     Impression:   ICD-10-CM   1. Shortness of breath  R06.02 ECHOCARDIOGRAM COMPLETE    2. Tachycardia  R00.0 EKG 12-Lead    EKG 12-Lead    3. Essential hypertension, benign  I10     4. Mixed  hyperlipidemia  E78.2     5. Agatston coronary artery calcium  score less than 100  R93.1        Recommendation(s):  Shortness of breath One isolated event back in December 2020/January 2025. Self-limited. Overall euvolemic and not in congestive heart failure. EKG illustrates normal sinus rhythm without underlying ischemia or injury pattern. Echo will be ordered to evaluate for structural heart disease and left ventricular systolic function.  For further risk stratification  Tachycardia Reason of consult. Patient denies having episodes of palpitations or known history of tachycardia. Will hold off on additional workup at this time and asymptomatic patient  Essential hypertension, benign Office blood pressures are acceptable but not at goal.  Just started on antihypertensive medications by PCP. Continue telmisartan 20 mg p.o. daily Reemphasized importance of low-salt diet. Recommended keeping a blood pressure log and to review with PCP to see if additional medication titration is warranted, recommended goal SBP 130 mmHg  Mixed hyperlipidemia Continue atorvastatin  80 mg p.o. every afternoon. Continue Zetia  10 mg p.o. every morning. Last LDL from December 2024 was 68 mg/dL as per [facility] database  Agatston coronary artery calcium  score less than 100 Total CAC was 12 back in August 2021, placing her at the 23rd percentile Given her minimal coronary artery calcification, aortic atherosclerosis, and family history of CAD continue lipid-lowering agents. Since patient is asymptomatic we will hold off on stress testing at this time. Echocardiogram as mentioned above for further risk stratification  As part of this consultation reviewed the notes provided by her primary care provider notes available in media section, outside labs independently reviewed, EKG ordered and independently reviewed, prior coronary calcium  score from August 2021 reviewed, medication reconciliation, and coordination of  care.  Orders Placed:  Orders Placed This Encounter  Procedures   EKG 12-Lead   EKG 12-Lead   ECHOCARDIOGRAM COMPLETE    Standing Status:   Future    Expected Date:   04/24/2024    Expiration Date:   04/17/2025    Where should this test be performed:   [facility]    Does the patient weigh less than or greater than 250 lbs?:   Patient weighs less than 250 lbs    Perflutren DEFINITY (image enhancing agent) should be administered unless hypersensitivity or allergy exist:   Administer Perflutren    Reason for exam-Echo:   Other-Full Diagnosis List    Full ICD-10/Reason for Exam:   SOB (shortness of breath) [098119]     Final Medication List:   No orders of the defined types were placed in this encounter.   Medications Discontinued During This Encounter  Medication Reason   ergocalciferol (VITAMIN D2) 1.25 MG (50000 UT) capsule Patient Preference     Current Outpatient Medications:    acetaminophen  (TYLENOL ) 325 MG tablet, Take 2 tablets (650 mg total) by mouth every 6 (six) hours as needed for mild pain (or Fever >/= 101)., Disp: 40 tablet, Rfl: 0   atorvastatin  (LIPITOR ) 80 MG tablet, Take 80 mg by mouth every evening. , Disp: , Rfl:    CELEBREX 200 MG capsule, , Disp: , Rfl:    ezetimibe  (ZETIA ) 10 MG tablet, Take 10 mg by mouth every morning., Disp: , Rfl:    Famotidine (PEPCID PO), Take 1,200 mg by mouth daily., Disp: , Rfl:    GLUCOSAMINE-CHONDROITIN PO, Take 1 Capful by mouth daily., Disp: , Rfl:    levothyroxine  (SYNTHROID , LEVOTHROID) 50 MCG tablet, Take 75-100 mcg by mouth daily before breakfast. Takes 2 tablets on Monday Wednesday and Friday and 1 and 1/2 the rest of the week, Disp: , Rfl:    liothyronine  (CYTOMEL ) 5 MCG tablet, Take 5 mcg by mouth 2 (two) times daily., Disp: , Rfl:    Multiple Vitamin (MULTIVITAMIN) capsule, Take 1 capsule by mouth daily., Disp: , Rfl:    telmisartan (MICARDIS) 20 MG tablet, Take 20 mg by mouth at bedtime., Disp: , Rfl:    XIIDRA 5  % SOLN, Apply 1 drop to eye 2 (two) times daily., Disp: , Rfl:   Consent:   NA  Disposition:   Follow-up in 2 years or sooner if needed. Patient may be asked to follow-up sooner based  on the results of the above-mentioned testing.  Her questions and concerns were addressed to her satisfaction. She voices understanding of the recommendations provided during this encounter.    Signed, Awilda Bogus, [facility]  [facility]  04/17/2024 8:58 AM

## 2024-04-27 DIAGNOSIS — M19041 Primary osteoarthritis, right hand: Secondary | ICD-10-CM | POA: Diagnosis not present

## 2024-04-27 DIAGNOSIS — M19042 Primary osteoarthritis, left hand: Secondary | ICD-10-CM | POA: Diagnosis not present

## 2024-04-27 DIAGNOSIS — Z791 Long term (current) use of non-steroidal anti-inflammatories (NSAID): Secondary | ICD-10-CM | POA: Diagnosis not present

## 2024-05-14 DIAGNOSIS — H18711 Corneal ectasia, right eye: Secondary | ICD-10-CM | POA: Diagnosis not present

## 2024-05-14 DIAGNOSIS — H16223 Keratoconjunctivitis sicca, not specified as Sjogren's, bilateral: Secondary | ICD-10-CM | POA: Diagnosis not present

## 2024-05-14 DIAGNOSIS — Z961 Presence of intraocular lens: Secondary | ICD-10-CM | POA: Diagnosis not present

## 2024-05-14 DIAGNOSIS — H43813 Vitreous degeneration, bilateral: Secondary | ICD-10-CM | POA: Diagnosis not present

## 2024-05-22 ENCOUNTER — Telehealth (HOSPITAL_COMMUNITY): Payer: Self-pay

## 2024-05-22 ENCOUNTER — Ambulatory Visit (HOSPITAL_COMMUNITY)
Admission: RE | Admit: 2024-05-22 | Discharge: 2024-05-22 | Disposition: A | Discharge status: Home / Self Care | Source: Ambulatory Visit | Attending: Cardiology | Admitting: Cardiology

## 2024-05-22 DIAGNOSIS — R0602 Shortness of breath: Secondary | ICD-10-CM | POA: Diagnosis not present

## 2024-05-22 LAB — ECHOCARDIOGRAM COMPLETE
Area-P 1/2: 2.91 cm2
S' Lateral: 2 cm

## 2024-05-22 MED ORDER — PERFLUTREN LIPID MICROSPHERE
1.0000 mL | INTRAVENOUS | Status: AC | PRN
  Administered 2024-05-22: 2 mL via INTRAVENOUS

## 2024-05-22 NOTE — Telephone Encounter (Signed)
 Patient called to confirm when she needed to be here for her appointment. Lori Meyers CCT

## 2024-05-23 ENCOUNTER — Ambulatory Visit: Payer: Self-pay | Admitting: Cardiology

## 2024-05-29 NOTE — Telephone Encounter (Signed)
 Pt returning call to a nurse. Pt is in [place]  and wants to be called back on her mobile number [phone]

## 2024-07-16 DIAGNOSIS — H1132 Conjunctival hemorrhage, left eye: Secondary | ICD-10-CM | POA: Diagnosis not present

## 2024-07-21 DIAGNOSIS — E039 Hypothyroidism, unspecified: Secondary | ICD-10-CM | POA: Diagnosis not present

## 2024-07-21 DIAGNOSIS — I10 Essential (primary) hypertension: Secondary | ICD-10-CM | POA: Diagnosis not present

## 2024-07-21 DIAGNOSIS — H9193 Unspecified hearing loss, bilateral: Secondary | ICD-10-CM | POA: Diagnosis not present

## 2024-07-21 DIAGNOSIS — E782 Mixed hyperlipidemia: Secondary | ICD-10-CM | POA: Diagnosis not present

## 2024-07-21 DIAGNOSIS — I700 Atherosclerosis of aorta: Secondary | ICD-10-CM | POA: Diagnosis not present

## 2024-07-21 DIAGNOSIS — R413 Other amnesia: Secondary | ICD-10-CM | POA: Diagnosis not present

## 2024-07-21 DIAGNOSIS — I2510 Atherosclerotic heart disease of native coronary artery without angina pectoris: Secondary | ICD-10-CM | POA: Diagnosis not present

## 2024-07-21 DIAGNOSIS — R7301 Impaired fasting glucose: Secondary | ICD-10-CM | POA: Diagnosis not present

## 2024-07-27 DIAGNOSIS — R899 Unspecified abnormal finding in specimens from other organs, systems and tissues: Secondary | ICD-10-CM | POA: Diagnosis not present

## 2024-08-07 DIAGNOSIS — R413 Other amnesia: Secondary | ICD-10-CM | POA: Diagnosis not present

## 2024-08-20 DIAGNOSIS — G309 Alzheimer's disease, unspecified: Secondary | ICD-10-CM | POA: Diagnosis not present

## 2024-08-20 DIAGNOSIS — F0280 Dementia in other diseases classified elsewhere without behavioral disturbance: Secondary | ICD-10-CM | POA: Diagnosis not present

## 2024-08-20 DIAGNOSIS — R9402 Abnormal brain scan: Secondary | ICD-10-CM | POA: Diagnosis not present

## 2024-09-25 DIAGNOSIS — Z1231 Encounter for screening mammogram for malignant neoplasm of breast: Secondary | ICD-10-CM | POA: Diagnosis not present

## 2024-09-29 DIAGNOSIS — L821 Other seborrheic keratosis: Secondary | ICD-10-CM | POA: Diagnosis not present

## 2024-09-29 DIAGNOSIS — Z86008 Personal history of in-situ neoplasm of other site: Secondary | ICD-10-CM | POA: Diagnosis not present

## 2024-09-29 DIAGNOSIS — D1801 Hemangioma of skin and subcutaneous tissue: Secondary | ICD-10-CM | POA: Diagnosis not present

## 2024-09-29 DIAGNOSIS — Z129 Encounter for screening for malignant neoplasm, site unspecified: Secondary | ICD-10-CM | POA: Diagnosis not present

## 2024-09-29 DIAGNOSIS — L578 Other skin changes due to chronic exposure to nonionizing radiation: Secondary | ICD-10-CM | POA: Diagnosis not present

## 2024-09-29 DIAGNOSIS — L565 Disseminated superficial actinic porokeratosis (DSAP): Secondary | ICD-10-CM | POA: Diagnosis not present

## 2024-09-29 DIAGNOSIS — W908XXS Exposure to other nonionizing radiation, sequela: Secondary | ICD-10-CM | POA: Diagnosis not present

## 2024-10-28 DIAGNOSIS — M19041 Primary osteoarthritis, right hand: Secondary | ICD-10-CM | POA: Diagnosis not present

## 2024-10-28 DIAGNOSIS — M19042 Primary osteoarthritis, left hand: Secondary | ICD-10-CM | POA: Diagnosis not present

## 2024-10-28 DIAGNOSIS — Z791 Long term (current) use of non-steroidal anti-inflammatories (NSAID): Secondary | ICD-10-CM | POA: Diagnosis not present

## 2024-11-10 DIAGNOSIS — G301 Alzheimer's disease with late onset: Secondary | ICD-10-CM | POA: Diagnosis not present

## 2024-11-10 DIAGNOSIS — F0280 Dementia in other diseases classified elsewhere without behavioral disturbance: Secondary | ICD-10-CM | POA: Diagnosis not present
